# Patient Record
Sex: Female | Born: 1940 | Race: Black or African American | Hispanic: No | Marital: Single
Health system: Southern US, Community
[De-identification: ages and names within clinical notes are randomized; demographics above are authoritative.]

## PROBLEM LIST (undated history)

## (undated) DIAGNOSIS — J849 Interstitial pulmonary disease, unspecified: Secondary | ICD-10-CM

## (undated) DIAGNOSIS — J449 Chronic obstructive pulmonary disease, unspecified: Secondary | ICD-10-CM

## (undated) DIAGNOSIS — J181 Lobar pneumonia, unspecified organism: Secondary | ICD-10-CM

## (undated) DIAGNOSIS — J9621 Acute and chronic respiratory failure with hypoxia: Secondary | ICD-10-CM

---

## 2019-06-07 ENCOUNTER — Inpatient Hospital Stay
Admission: RE | Admit: 2019-06-07 | Discharge: 2019-07-08 | Disposition: A | Discharge status: Skilled Nursing Facility | Payer: Medicare Other | Source: Other Acute Inpatient Hospital | Attending: General Practice | Admitting: General Practice

## 2019-06-07 DIAGNOSIS — J181 Lobar pneumonia, unspecified organism: Secondary | ICD-10-CM | POA: Diagnosis present

## 2019-06-07 DIAGNOSIS — J9690 Respiratory failure, unspecified, unspecified whether with hypoxia or hypercapnia: Secondary | ICD-10-CM

## 2019-06-07 DIAGNOSIS — J9621 Acute and chronic respiratory failure with hypoxia: Secondary | ICD-10-CM | POA: Diagnosis present

## 2019-06-07 DIAGNOSIS — J849 Interstitial pulmonary disease, unspecified: Secondary | ICD-10-CM | POA: Diagnosis present

## 2019-06-07 DIAGNOSIS — J189 Pneumonia, unspecified organism: Secondary | ICD-10-CM

## 2019-06-07 DIAGNOSIS — R509 Fever, unspecified: Secondary | ICD-10-CM

## 2019-06-07 DIAGNOSIS — J449 Chronic obstructive pulmonary disease, unspecified: Secondary | ICD-10-CM | POA: Diagnosis present

## 2019-06-07 HISTORY — DX: Interstitial pulmonary disease, unspecified: J84.9

## 2019-06-07 HISTORY — DX: Acute and chronic respiratory failure with hypoxia: J96.21

## 2019-06-07 HISTORY — DX: Chronic obstructive pulmonary disease, unspecified: J44.9

## 2019-06-07 HISTORY — DX: Lobar pneumonia, unspecified organism: J18.1

## 2019-06-07 LAB — URINALYSIS, ROUTINE W REFLEX MICROSCOPIC
Bilirubin Urine: NEGATIVE
Glucose, UA: NEGATIVE mg/dL
Hgb urine dipstick: NEGATIVE
Ketones, ur: NEGATIVE mg/dL
Leukocytes,Ua: NEGATIVE
Nitrite: NEGATIVE
Protein, ur: NEGATIVE mg/dL
Specific Gravity, Urine: 1.005 (ref 1.005–1.030)
pH: 8 (ref 5.0–8.0)

## 2019-06-08 ENCOUNTER — Other Ambulatory Visit (HOSPITAL_COMMUNITY): Payer: Medicare Other

## 2019-06-08 LAB — PROTIME-INR
INR: 1.2 (ref 0.8–1.2)
Prothrombin Time: 14.8 seconds (ref 11.4–15.2)

## 2019-06-08 LAB — MAGNESIUM: Magnesium: 1.9 mg/dL (ref 1.7–2.4)

## 2019-06-08 LAB — CBC WITH DIFFERENTIAL/PLATELET
Abs Immature Granulocytes: 0.96 10*3/uL — ABNORMAL HIGH (ref 0.00–0.07)
Basophils Absolute: 0.2 10*3/uL — ABNORMAL HIGH (ref 0.0–0.1)
Basophils Relative: 1 %
Eosinophils Absolute: 0.5 10*3/uL (ref 0.0–0.5)
Eosinophils Relative: 2 %
HCT: 43.1 % (ref 36.0–46.0)
Hemoglobin: 13.7 g/dL (ref 12.0–15.0)
Immature Granulocytes: 5 %
Lymphocytes Relative: 7 %
Lymphs Abs: 1.3 10*3/uL (ref 0.7–4.0)
MCH: 24.9 pg — ABNORMAL LOW (ref 26.0–34.0)
MCHC: 31.8 g/dL (ref 30.0–36.0)
MCV: 78.2 fL — ABNORMAL LOW (ref 80.0–100.0)
Monocytes Absolute: 1.5 10*3/uL — ABNORMAL HIGH (ref 0.1–1.0)
Monocytes Relative: 8 %
Neutro Abs: 15.4 10*3/uL — ABNORMAL HIGH (ref 1.7–7.7)
Neutrophils Relative %: 77 %
Platelets: 267 10*3/uL (ref 150–400)
RBC: 5.51 MIL/uL — ABNORMAL HIGH (ref 3.87–5.11)
RDW: 18.2 % — ABNORMAL HIGH (ref 11.5–15.5)
WBC: 19.8 10*3/uL — ABNORMAL HIGH (ref 4.0–10.5)
nRBC: 0 % (ref 0.0–0.2)

## 2019-06-08 LAB — COMPREHENSIVE METABOLIC PANEL
ALT: 12 U/L (ref 0–44)
AST: 17 U/L (ref 15–41)
Albumin: 2.5 g/dL — ABNORMAL LOW (ref 3.5–5.0)
Alkaline Phosphatase: 64 U/L (ref 38–126)
Anion gap: 11 (ref 5–15)
BUN: 14 mg/dL (ref 8–23)
CO2: 28 mmol/L (ref 22–32)
Calcium: 8.7 mg/dL — ABNORMAL LOW (ref 8.9–10.3)
Chloride: 99 mmol/L (ref 98–111)
Creatinine, Ser: 0.64 mg/dL (ref 0.44–1.00)
GFR calc Af Amer: >60 mL/min (ref >60)
GFR calc non Af Amer: >60 mL/min (ref >60)
Glucose, Bld: 75 mg/dL (ref 70–99)
Potassium: 3.6 mmol/L (ref 3.5–5.1)
Sodium: 138 mmol/L (ref 135–145)
Total Bilirubin: 0.7 mg/dL (ref 0.3–1.2)
Total Protein: 5.7 g/dL — ABNORMAL LOW (ref 6.5–8.1)

## 2019-06-08 LAB — PROCALCITONIN: Procalcitonin: <0.1 ng/mL

## 2019-06-08 LAB — PHOSPHORUS: Phosphorus: 2.9 mg/dL (ref 2.5–4.6)

## 2019-06-08 MED ORDER — BENZOCAINE-MENTHOL 15-3.6 MG MT LOZG
1.00 | LOZENGE | OROMUCOSAL | Status: DC
Start: ? — End: 2019-06-08

## 2019-06-08 MED ORDER — IPRATROPIUM-ALBUTEROL 0.5-2.5 (3) MG/3ML IN SOLN
3.00 | RESPIRATORY_TRACT | Status: DC
Start: 2019-06-07 — End: 2019-06-08

## 2019-06-08 MED ORDER — GENERIC EXTERNAL MEDICATION
Status: DC
Start: ? — End: 2019-06-08

## 2019-06-08 MED ORDER — METHYLPREDNISOLONE SODIUM SUCC 40 MG IJ SOLR
10.00 | INTRAMUSCULAR | Status: DC
Start: 2019-06-08 — End: 2019-06-08

## 2019-06-08 MED ORDER — GENERIC EXTERNAL MEDICATION
500.00 | Status: DC
Start: 2019-06-07 — End: 2019-06-08

## 2019-06-08 MED ORDER — ENOXAPARIN SODIUM 40 MG/0.4ML ~~LOC~~ SOLN
40.00 | SUBCUTANEOUS | Status: DC
Start: 2019-06-08 — End: 2019-06-08

## 2019-06-08 MED ORDER — FERROUS SULFATE 325 (65 FE) MG PO TABS
325.00 | ORAL_TABLET | ORAL | Status: DC
Start: 2019-06-08 — End: 2019-06-08

## 2019-06-08 MED ORDER — NITROGLYCERIN 0.4 MG SL SUBL
0.40 | SUBLINGUAL_TABLET | SUBLINGUAL | Status: DC
Start: ? — End: 2019-06-08

## 2019-06-08 MED ORDER — MIDAZOLAM HCL 2 MG/2ML IJ SOLN
1.00 | INTRAMUSCULAR | Status: DC
Start: ? — End: 2019-06-08

## 2019-06-08 MED ORDER — FLUTICASONE PROPIONATE 50 MCG/ACT NA SUSP
1.00 | NASAL | Status: DC
Start: 2019-06-08 — End: 2019-06-08

## 2019-06-08 MED ORDER — SENNA-DOCUSATE SODIUM 8.6-50 MG PO TABS
1.00 | ORAL_TABLET | ORAL | Status: DC
Start: ? — End: 2019-06-08

## 2019-06-08 MED ORDER — HYDROCODONE-HOMATROPINE 5-1.5 MG/5ML PO SYRP
5.00 | ORAL_SOLUTION | ORAL | Status: DC
Start: ? — End: 2019-06-08

## 2019-06-08 MED ORDER — CARBOXYMETHYLCELLULOSE SODIUM OP
5.00 | OPHTHALMIC | Status: DC
Start: ? — End: 2019-06-08

## 2019-06-08 MED ORDER — SODIUM CHLORIDE 0.9 % IV SOLN
10.00 | INTRAVENOUS | Status: DC
Start: ? — End: 2019-06-08

## 2019-06-08 MED ORDER — ALPRAZOLAM 0.25 MG PO TABS
0.13 | ORAL_TABLET | ORAL | Status: DC
Start: ? — End: 2019-06-08

## 2019-06-08 MED ORDER — CVS TUSSIN DM CLEAR PO
0.50 | ORAL | Status: DC
Start: ? — End: 2019-06-08

## 2019-06-08 MED ORDER — ALUM & MAG HYDROXIDE-SIMETH 200-200-20 MG/5ML PO SUSP
30.00 | ORAL | Status: DC
Start: ? — End: 2019-06-08

## 2019-06-08 MED ORDER — BUDESONIDE-FORMOTEROL FUMARATE 160-4.5 MCG/ACT IN AERO
2.00 | INHALATION_SPRAY | RESPIRATORY_TRACT | Status: DC
Start: 2019-06-07 — End: 2019-06-08

## 2019-06-08 MED ORDER — GUAIFENESIN 400 MG PO TABS
400.00 | ORAL_TABLET | ORAL | Status: DC
Start: ? — End: 2019-06-08

## 2019-06-08 MED ORDER — LORATADINE 10 MG PO TABS
10.00 | ORAL_TABLET | ORAL | Status: DC
Start: 2019-06-08 — End: 2019-06-08

## 2019-06-08 MED ORDER — ACETAMINOPHEN 325 MG PO TABS
650.00 | ORAL_TABLET | ORAL | Status: DC
Start: ? — End: 2019-06-08

## 2019-06-08 MED ORDER — VITAMIN D3 25 MCG (1000 UNIT) PO TABS
1000.00 | ORAL_TABLET | ORAL | Status: DC
Start: 2019-06-08 — End: 2019-06-08

## 2019-06-09 ENCOUNTER — Other Ambulatory Visit (HOSPITAL_COMMUNITY): Payer: Medicare Other

## 2019-06-09 LAB — CBC
HCT: 35.5 % — ABNORMAL LOW (ref 36.0–46.0)
Hemoglobin: 11.6 g/dL — ABNORMAL LOW (ref 12.0–15.0)
MCH: 25.6 pg — ABNORMAL LOW (ref 26.0–34.0)
MCHC: 32.7 g/dL (ref 30.0–36.0)
MCV: 78.2 fL — ABNORMAL LOW (ref 80.0–100.0)
Platelets: 235 10*3/uL (ref 150–400)
RBC: 4.54 MIL/uL (ref 3.87–5.11)
RDW: 17.5 % — ABNORMAL HIGH (ref 11.5–15.5)
WBC: 19.7 10*3/uL — ABNORMAL HIGH (ref 4.0–10.5)
nRBC: 0 % (ref 0.0–0.2)

## 2019-06-09 LAB — BASIC METABOLIC PANEL
Anion gap: 11 (ref 5–15)
BUN: 12 mg/dL (ref 8–23)
CO2: 28 mmol/L (ref 22–32)
Calcium: 8.3 mg/dL — ABNORMAL LOW (ref 8.9–10.3)
Chloride: 98 mmol/L (ref 98–111)
Creatinine, Ser: 0.41 mg/dL — ABNORMAL LOW (ref 0.44–1.00)
GFR calc Af Amer: >60 mL/min (ref >60)
GFR calc non Af Amer: >60 mL/min (ref >60)
Glucose, Bld: 96 mg/dL (ref 70–99)
Potassium: 3.7 mmol/L (ref 3.5–5.1)
Sodium: 137 mmol/L (ref 135–145)

## 2019-06-09 LAB — MAGNESIUM: Magnesium: 1.9 mg/dL (ref 1.7–2.4)

## 2019-06-09 LAB — URINE CULTURE: Culture: <10000 — AB

## 2019-06-09 LAB — PHOSPHORUS: Phosphorus: 3.1 mg/dL (ref 2.5–4.6)

## 2019-06-09 MED ORDER — GENERIC EXTERNAL MEDICATION
Status: DC
Start: ? — End: 2019-06-09

## 2019-06-10 DIAGNOSIS — J449 Chronic obstructive pulmonary disease, unspecified: Secondary | ICD-10-CM | POA: Diagnosis not present

## 2019-06-10 DIAGNOSIS — J181 Lobar pneumonia, unspecified organism: Secondary | ICD-10-CM

## 2019-06-10 DIAGNOSIS — J849 Interstitial pulmonary disease, unspecified: Secondary | ICD-10-CM

## 2019-06-10 DIAGNOSIS — J9621 Acute and chronic respiratory failure with hypoxia: Secondary | ICD-10-CM | POA: Diagnosis not present

## 2019-06-10 LAB — BASIC METABOLIC PANEL
Anion gap: 9 (ref 5–15)
BUN: 12 mg/dL (ref 8–23)
CO2: 29 mmol/L (ref 22–32)
Calcium: 8.4 mg/dL — ABNORMAL LOW (ref 8.9–10.3)
Chloride: 97 mmol/L — ABNORMAL LOW (ref 98–111)
Creatinine, Ser: 0.46 mg/dL (ref 0.44–1.00)
GFR calc Af Amer: >60 mL/min (ref >60)
GFR calc non Af Amer: >60 mL/min (ref >60)
Glucose, Bld: 132 mg/dL — ABNORMAL HIGH (ref 70–99)
Potassium: 4.1 mmol/L (ref 3.5–5.1)
Sodium: 135 mmol/L (ref 135–145)

## 2019-06-10 LAB — CULTURE, RESPIRATORY W GRAM STAIN

## 2019-06-10 LAB — MAGNESIUM: Magnesium: 2.1 mg/dL (ref 1.7–2.4)

## 2019-06-10 NOTE — Consult Note (Signed)
Infectious Disease Consultation   Kara Alvarez  RUE:[id number]  DOB: 1941/01/16  DOA: 06/07/2019  Requesting physician: Dr.Hijazi  Reason for consultation: Antibiotic recommendations   History of Present Illness: History obtained from the medical records and personally confirmed with the patient. Kara Alvarez is an 78 y.o. female with history of COPD, emphysema, chronic hypoxemic respiratory failure, aortic aneurysm, atrial regurgitation, breast cancer that was hospitalized 9/19 through 10/1.  She returned the following day with worsening dyspnea.  At the time of admission she was in significant respiratory distress.  She was screened for Covid multiple times without any positive results.  At the time of arrival in the hospital she was found to have a PaO2 of 65 on 3 L of oxygen.  pH 7.5.  WBC count was elevated to 50,000 with 82% neutrophils.  Chest x-ray on 05/29/2019 showed emphysema with diffuse interstitial changes and fibrotic appearance with possible superimposed opacities suggestive of superimposed infection, suspected pneumonia.  She was started on IV vancomycin, cefepime and was given another course of azithromycin.  Blood cultures at the time of admission were negative.  MRSA PCR was negative.  Legionella antigen was negative.  Viral respiratory panel was also negative.  Acute on chronic hypoxemic respiratory failure was suspected secondary to COPD exacerbation, superimposed bacterial pneumonia.  There was also concern for interstitial lung disease.  She had reportedly failed outpatient treatment with Levaquin.  Infectious disease was consulted.  Patient did require intubation but was ultimately extubated and currently on oxygen by nasal cannula.  Antibiotics were later switched to meropenem.  BAL cultures did not show anything significant.  However, given the severity of her illness infectious disease recommended to continue treatment with meropenem for another 1 to 2 weeks.   There is also some concern for CT changes reflecting recurrent malignancy with history of breast cancer and lymphangitic spread.  She is currently on oxygen by nasal cannula.  She denies having any chest pain at this time.  She denies having any cough or fevers.  However, she is complaining of worsening shortness of breath especially when she moves around.  Review of Systems:  Complete review of system negative except as mentioned above in HPI.   Past Medical History: COPD, chronic respiratory failure, breast cancer, ascending aortic aneurysm, emphysema, hypertension, nonrheumatic mitral regurgitation  Past Surgical History: Mastectomy 12/08, 09/01, rotator cuff repair left 2007, tubal ligation 1977, vein ligation and stripping left  Allergies: Please see MAR  Social History: She is a former smoker, no history of alcohol or any other recreational drug abuse  Family History: History of colon cancer in mother, hypertension mother, prostate cancer in father, stroke and hypertension in her father, diabetes history, hypertension and sister.   Physical Exam: Temperature 98.1, pulse 99, respiratory rate 20, blood pressure 106/57, pulse oximetry 95% on 30 L with 85% FiO2. Constitutional: Thin, frail female, oriented x3. Eyes: PERLA, EOMI, irises appear normal, anicteric sclera,  ENMT: external ears and nose appear normal, normal hearing            Lips appears normal, oropharynx mucosa, tongue appear normal  Neck: neck appears normal, no masses, normal ROM, no thyromegaly, no JVD  CVS: S1-S2, no gallops, no LE edema, normal pedal pulses  Respiratory: Coarse breath sounds, rhonchi, scattered crackles Abdomen: soft nontender, nondistended, normal bowel sounds  Musculoskeletal: no cyanosis, clubbing or edema noted bilaterally  Neuro: She has some generalized weakness otherwise no focal deficits Psych: judgement and insight appear normal, stable mood and affect, mental  status Skin: no rashes   Data reviewed:  I have personally reviewed following labs and imaging studies Labs:  CBC: Recent Labs  Lab 06/08/19 0734 06/09/19 0526  WBC 19.8* 19.7*  NEUTROABS 15.4*  --   HGB 13.7 11.6*  HCT 43.1 35.5*  MCV 78.2* 78.2*  PLT 267 235    Basic Metabolic Panel: Recent Labs  Lab 06/08/19 0734 06/09/19 0526 06/10/19 0652  NA 138 137 135  K 3.6 3.7 4.1  CL 99 98 97*  CO2 28 28 29   GLUCOSE 75 96 132*  BUN 14 12 12   CREATININE 0.64 0.41* 0.46  CALCIUM 8.7* 8.3* 8.4*  MG 1.9 1.9 2.1  PHOS 2.9 3.1  --    GFR CrCl cannot be calculated (Unknown ideal weight.). Liver Function Tests: Recent Labs  Lab 06/08/19 0734  AST 17  ALT 12  ALKPHOS 64  BILITOT 0.7  PROT 5.7*  ALBUMIN 2.5*   No results for input(s): LIPASE, AMYLASE in the last 168 hours. No results for input(s): AMMONIA in the last 168 hours. Coagulation profile Recent Labs  Lab 06/08/19 0734  INR 1.2    Cardiac Enzymes: No results for input(s): CKTOTAL, CKMB, CKMBINDEX, TROPONINI in the last 168 hours. BNP: Invalid input(s): POCBNP CBG: No results for input(s): GLUCAP in the last 168 hours. D-Dimer No results for input(s): DDIMER in the last 72 hours. Hgb A1c No results for input(s): HGBA1C in the last 72 hours. Lipid Profile No results for input(s): CHOL, HDL, LDLCALC, TRIG, CHOLHDL, LDLDIRECT in the last 72 hours. Thyroid function studies No results for input(s): TSH, T4TOTAL, T3FREE, THYROIDAB in the last 72 hours.  Invalid input(s): FREET3 Anemia work up No results for input(s): VITAMINB12, FOLATE, FERRITIN, TIBC, IRON, RETICCTPCT in the last 72 hours. Urinalysis    Component Value Date/Time   COLORURINE STRAW (A) 06/07/2019 1822   APPEARANCEUR CLEAR 06/07/2019 1822   LABSPEC 1.005 06/07/2019 1822   PHURINE 8.0 06/07/2019 1822   GLUCOSEU NEGATIVE 06/07/2019 1822   HGBUR NEGATIVE 06/07/2019 1822   BILIRUBINUR NEGATIVE 06/07/2019 1822   KETONESUR NEGATIVE  06/07/2019 1822   PROTEINUR NEGATIVE 06/07/2019 1822   NITRITE NEGATIVE 06/07/2019 1822   LEUKOCYTESUR NEGATIVE 06/07/2019 1822     Microbiology Recent Results (from the past 240 hour(s))  Culture, blood (routine x 2)     Status: None (Preliminary result)   Collection Time: 06/07/19  7:40 PM   Specimen: BLOOD  Result Value Ref Range Status   Specimen Description BLOOD LEFT ARM  Final   Special Requests   Final    BOTTLES DRAWN AEROBIC ONLY Blood Culture results may not be optimal due to an inadequate volume of blood received in culture bottles   Culture   Final    NO GROWTH 2 DAYS Performed at [facility], [address] [place], [place] [address]    Report Status PENDING  Incomplete  Culture, blood (routine x 2)     Status: None (Preliminary result)   Collection Time: 06/07/19  7:49 PM   Specimen: BLOOD  Result Value Ref Range Status   Specimen Description BLOOD LEFT HAND  Final   Special Requests   Final    BOTTLES DRAWN AEROBIC ONLY Blood Culture results may not be optimal due to an inadequate volume of blood received in culture bottles   Culture   Final  NO GROWTH 2 DAYS Performed at [facility], [place] [address] [place], [place] [address]    Report Status PENDING  Incomplete  Culture, Urine     Status: Abnormal   Collection Time: 06/07/19 10:30 PM   Specimen: Urine, Random  Result Value Ref Range Status   Specimen Description URINE, RANDOM  Final   Special Requests NONE  Final   Culture (A)  Final    <10,000 COLONIES/mL INSIGNIFICANT GROWTH Performed at [facility] [address], [place], [place] [address]    Report Status 06/09/2019 FINAL  Final  Culture, respiratory     Status: None   Collection Time: 06/08/19  5:10 AM   Specimen: Tracheal Aspirate  Result Value Ref Range Status   Specimen Description TRACHEAL ASPIRATE  Final   Special Requests NONE  Final   Gram Stain   Final    FEW SQUAMOUS EPITHELIAL CELLS PRESENT NO WBC  SEEN MODERATE GRAM POSITIVE COCCI IN CLUSTERS IN PAIRS FEW YEAST FEW GRAM NEGATIVE RODS Performed at [facility], [place] [address] [place], [place] [address]    Culture ABUNDANT CANDIDA ALBICANS  Final   Report Status 06/10/2019 FINAL  Final       Inpatient Medications:   Please see MAR   Radiological Exams on Admission: Dg Chest Port 1 View  Result Date: 06/09/2019 CLINICAL DATA:  Pneumonia. EXAM: PORTABLE CHEST 1 VIEW COMPARISON:  June 08, 2019. FINDINGS: Stable cardiomegaly. No pneumothorax is noted. Stable multiple opacities are noted throughout both lungs. Small right pleural effusion is noted. Bony thorax is unremarkable. IMPRESSION: Stable multifocal pneumonia. Electronically Signed   By: Marijo Conception M.D.   On: 06/09/2019 12:51    Impression/Recommendations Acute on chronic hypoxemic respiratory failure Bilateral pneumonia Severe COPD with exacerbation Leukocytosis Interstitial lung disease History of breast cancer Hypertension Protein calorie malnutrition Debility  Acute on chronic hypoxemic respiratory failure: Multifactorial.  Likely secondary to severe COPD with exacerbation.  However, patient also noted to have diffuse interstitial changes could be secondary to pneumonia but could also be interstitial lung disease with interstitial fibrosis.  If her respiratory status does not improve, would recommend chest CT to better evaluate.  Bilateral pneumonia: Patient was previously on azithromycin at the outside hospital.  She failed outpatient treatment with Levaquin.  Her respiratory status continued to worsen therefore she was initiated on IV meropenem at outside facility.  She seems to be somewhat responding to the IV meropenem.  We will plan to treat until 06/22/2019.  However, there may be some component of interstitial lung disease as well as mentioned above which could be contributing to the respiratory failure.  If she starts having any fevers greater  than 101 would recommend to send for pan cultures. She denies having sputum production at this time.  Severe COPD with exacerbation: Patient currently on steroids.  Pulmonary also following.  Continue management per primary team and pulmonary.    Leukocytosis: Likely secondary to the steroids?  Already on antibiotics.  Currently afebrile.  Continue to monitor.  If she is worsening recommend chest CT to evaluate.  History of breast cancer: There was some concern for progression of her breast cancer and also lymphangitic spread per the discharge notes from the outside facility.  Per oncology they advised supportive care.  When able, and if she decides to pursue evaluation and treatment she would need CT chest abdomen and pelvis and bone scan with biopsy if any abnormality.  Protein calorie  malnutrition: Management per the primary team.  Debility: Continue supportive management and therapy per the primary team.  Due to her complex medical problems she is a high risk for worsening anticoagulation.  Thank you for this consultation.      Vonzella Nipple M.D. 06/10/2019, 5:22 PM

## 2019-06-10 NOTE — Consult Note (Signed)
[facility]  Date of Service: 06/10/2019  PULMONARY CRITICAL CARE Kara Alvarez  WUJ:[id number]  DOB: Mar 23, 1941   DOA: 06/07/2019  Referring Physician: Merton Border, MD  HPI: Kara Alvarez is a 78 y.o. female seen for follow up of Acute on Chronic Respiratory Failure.  Patient has multiple medical problems including aortic aneurysm breast cancer COPD emphysema essential hypertension mitral regurgitation presented to the hospital with increasing shortness of breath chills.  Patient was evaluated at that time was found to have pneumonia bilaterally and also was in respiratory failure.  She was tested for COVID-19 which was found to be negative on multiple attempts.  Her history goes back to late August where she apparently got worse with shortness of breath and had been placed on some steroids.  The patient at that time was not felt to be having any infectious process.  Presentation this time patient had a PO2 of 65 on 3 L of oxygen.  Based on the CT results from the previous facility she had a diffuse lung disease with groundglass opacities.  Her work-up at the other facility also included doing bronchoscopy.  There is concern whether the patient may also have some interstitial lung disease.  CT scan here is not been done yet  Review of Systems:  ROS performed and is unremarkable other than noted above.  Past Medical History:  Diagnosis Date  . Ascending aortic aneurysm (*)  . Breast cancer (*) 04/2000  T2N0M0 stage IIA s/p R-mast/chemo/XRT  . Breast cancer (*) 2008  T1cN0M0 stage II s/p L-mast. Intolerant of AI  . Contact and allergic dermatitis of eyelid  . Emphysema lung (*) 12/21/2013  Noted incidentally on CT scan of the chest; No symptoms  . Essential hypertension 01/18/2017  . Non-rheumatic mitral regurgitation 01/18/2017   Past Surgical History:  Procedure Laterality Date  . Mastectomy 12/08, 09/01   . Rotator cuff repair Left 2007  . Tubal ligation 1977  . Vein ligation and stripping Left   Family History  Problem Relation Age of Onset  . Cancer Mother  colon  . Hypertension Mother  . Cancer Father  prostate  . Stroke Father  . Hypertension Father  . Diabetes Other  . Hypertension Sister  . Heart disease Neg Hx   Social History   Socioeconomic History  . Marital status: Single  Spouse name: Not on file  . Number of children: Not on file  . Years of education: Not on file  . Highest education level: Not on file  Occupational History  . Not on file  Social Needs  . Financial resource strain: Not on file  . Food insecurity  Worry: Not on file  Inability: Not on file  . Transportation needs  Medical: Not on file  Non-medical: Not on file  Tobacco Use  . Smoking status: Former Smoker  Packs/day: 0.75  Years: 39.00  Pack years: 29.25  Quit date: 12/02/1993     Medications: Reviewed on Rounds  Physical Exam:  Vitals: Temperature 97.4 pulse 80 respiratory rate 30 blood pressure 105/62 saturations 96%  Ventilator Settings off the ventilator right now on high flow nasal cannula patient is on 30 L with 85% FiO2  . General: Comfortable at this time . Eyes: Grossly normal lids, irises & conjunctiva . ENT: grossly tongue is normal . Neck: no obvious mass . Cardiovascular: S1-S2 normal no gallop or rub . Respiratory: Coarse breath sounds scattered crackles .  Abdomen: Soft nontender . Skin: no rash seen on limited exam . Musculoskeletal: not rigid . Psychiatric:unable to assess . Neurologic: no seizure no involuntary movements         Labs on Admission:  Basic Metabolic Panel: Recent Labs  Lab 06/08/19 0734 06/09/19 0526 06/10/19 0652  NA 138 137 135  K 3.6 3.7 4.1  CL 99 98 97*  CO2 28 28 29   GLUCOSE 75 96 132*  BUN 14 12 12   CREATININE 0.64 0.41* 0.46  CALCIUM 8.7* 8.3* 8.4*  MG 1.9 1.9 2.1  PHOS 2.9 3.1  --     No results for input(s):  PHART, PCO2ART, PO2ART, HCO3, O2SAT in the last 168 hours.  Liver Function Tests: Recent Labs  Lab 06/08/19 0734  AST 17  ALT 12  ALKPHOS 64  BILITOT 0.7  PROT 5.7*  ALBUMIN 2.5*   No results for input(s): LIPASE, AMYLASE in the last 168 hours. No results for input(s): AMMONIA in the last 168 hours.  CBC: Recent Labs  Lab 06/08/19 0734 06/09/19 0526  WBC 19.8* 19.7*  NEUTROABS 15.4*  --   HGB 13.7 11.6*  HCT 43.1 35.5*  MCV 78.2* 78.2*  PLT 267 235    Cardiac Enzymes: No results for input(s): CKTOTAL, CKMB, CKMBINDEX, TROPONINI in the last 168 hours.  BNP (last 3 results) No results for input(s): BNP in the last 8760 hours.  ProBNP (last 3 results) No results for input(s): PROBNP in the last 8760 hours.   Radiological Exams on Admission: Dg Chest Port 1 View  Result Date: 06/09/2019 CLINICAL DATA:  Pneumonia. EXAM: PORTABLE CHEST 1 VIEW COMPARISON:  June 08, 2019. FINDINGS: Stable cardiomegaly. No pneumothorax is noted. Stable multiple opacities are noted throughout both lungs. Small right pleural effusion is noted. Bony thorax is unremarkable. IMPRESSION: Stable multifocal pneumonia. Electronically Signed   By: 06/11/2019 M.D.   On: 06/09/2019 12:51   Dg Chest Port 1 View  Result Date: 06/08/2019 CLINICAL DATA:  Shortness of breath. EXAM: PORTABLE CHEST 1 VIEW COMPARISON:  None. FINDINGS: Mild cardiomegaly is noted. Atherosclerosis of thoracic aorta is noted. No pneumothorax is noted. Bilateral lung opacities are noted concerning for multifocal pneumonia. Small bilateral pleural effusions are noted. Bony thorax is unremarkable. IMPRESSION: Bilateral lung opacities are noted concerning for multifocal pneumonia. Aortic Atherosclerosis (ICD10-I70.0). Electronically Signed   By: 06/11/2019 M.D.   On: 06/08/2019 07:10    Acute Interface, Incoming Rad Results - 05/17/2019 11:47 AM EDT INDICATION: Chest pain, PE suspected, high prob h/o breast ca with  hypoxia. COMPARISON:  August 25, 2018.  TECHNIQUE:  Routine CT pulmonary angiogram with contrast was performed using Contrast:  90 mL mL of Isovue 370.  Multiplanar 2D and angiographic 3D MIP images were constructed and reviewed. Radiation dose reduction was utilized (automated exposure  control, mA or kV adjustment based on patient size, or iterative image reconstruction).  FINDINGS: MEDIASTINUM/GREAT VESSELS: Cardiomegaly. The ascending thoracic aorta measures 4.4 cm, which is unchanged. There is mediastinal and right hilar lymphadenopathy. Index subcarinal lymph node measuring 2.7 x 1.8 cm. Index right hilar lymph node measuring 2.5 x 1.7 cm.. Enlarged  central pulmonary arteries. No filling defects within the pulmonary arterial tree to suggest underlying pulmonary emboli. No acute abnormality of the thoracic aorta or other great vessels of the mediastinum.   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  Limitations:  None  LUNG WINDOWS:  Background of centrilobular and paraseptal emphysema. There  are new basilar predominant subpleural groundglass opacities and septal thickening. Apical scarring.  UPPER ABDOMEN:  Stable small cyst in the left hepatic lobe. Small left renal cyst.  BONE WINDOWS:  No acute osseous findings. Mastectomies.   IMPRESSION:  1. Negative for pulmonary embolism. 2. Stable ascending aortic aneurysm. 3. Enlarged central pulmonary arteries suggestive of pulmonary arterial hypertension. 4. New basilar predominant subpleural opacities suggestive of pneumonia. 5. Mediastinal and right hilar lymphadenopathy which may relate to impression #4. Recommend attention on follow-up imaging to ensure normalization of the lymph nodes. 6. Emphysema.   Electronically Signed by: Barrett HenleJeremy Cuda   Assessment/Plan Active Problems:   Acute on chronic respiratory failure with hypoxia (HCC)   Interstitial lung disease (HCC)   Lobar pneumonia,  unspecified organism (HCC)   COPD, severe (HCC)   1. Acute on chronic respiratory failure with hypoxia patient has diffuse interstitial changes which could be pneumonia but more likely is interstitial fibrosis.  The CT scan done at the other facility had shown significant changes with subpleural opacities and also groundglass changes.  I would recommend doing a follow-up CT scan here to reassess if there has been any change since September 2. Possible pulmonary fibrosis see discussion above discussion above.  She may have early changes of interstitial fibrosis would recommend getting a follow-up CT scan now. 3. Lobar pneumonia patient treated with antibiotics as already mentioned at the other facility.  She still had some groundglass opacities but this is a CT back from September that we are referencing. 4. COPD by history nebulizers as necessary we will continue with supportive care.  As already mentioned above need to get a follow-up CT scan of the chest which I have asked for.  Currently oxygen requirements are significantly elevated  I have personally seen and evaluated the patient, evaluated laboratory and imaging results, formulated the assessment and plan and placed orders. The Patient requires high complexity decision making for assessment and support.  Case was discussed on Rounds with the Respiratory Therapy Staff Time Spent 70minutes  Yevonne PaxSaadat A Darrnell Mangiaracina, MD [facility] Pulmonary Critical Care Medicine Sleep Medicine

## 2019-06-11 ENCOUNTER — Encounter: Payer: Self-pay | Admitting: Internal Medicine

## 2019-06-11 DIAGNOSIS — J9621 Acute and chronic respiratory failure with hypoxia: Secondary | ICD-10-CM | POA: Diagnosis present

## 2019-06-11 DIAGNOSIS — J449 Chronic obstructive pulmonary disease, unspecified: Secondary | ICD-10-CM | POA: Diagnosis present

## 2019-06-11 DIAGNOSIS — J849 Interstitial pulmonary disease, unspecified: Secondary | ICD-10-CM | POA: Diagnosis not present

## 2019-06-11 DIAGNOSIS — J181 Lobar pneumonia, unspecified organism: Secondary | ICD-10-CM | POA: Diagnosis present

## 2019-06-11 LAB — BASIC METABOLIC PANEL
Anion gap: 8 (ref 5–15)
BUN: 15 mg/dL (ref 8–23)
CO2: 32 mmol/L (ref 22–32)
Calcium: 8.7 mg/dL — ABNORMAL LOW (ref 8.9–10.3)
Chloride: 99 mmol/L (ref 98–111)
Creatinine, Ser: 0.51 mg/dL (ref 0.44–1.00)
GFR calc Af Amer: >60 mL/min (ref >60)
GFR calc non Af Amer: >60 mL/min (ref >60)
Glucose, Bld: 119 mg/dL — ABNORMAL HIGH (ref 70–99)
Potassium: 3.9 mmol/L (ref 3.5–5.1)
Sodium: 139 mmol/L (ref 135–145)

## 2019-06-11 LAB — CBC
HCT: 38.2 % (ref 36.0–46.0)
Hemoglobin: 11.9 g/dL — ABNORMAL LOW (ref 12.0–15.0)
MCH: 24.6 pg — ABNORMAL LOW (ref 26.0–34.0)
MCHC: 31.2 g/dL (ref 30.0–36.0)
MCV: 79.1 fL — ABNORMAL LOW (ref 80.0–100.0)
Platelets: 275 10*3/uL (ref 150–400)
RBC: 4.83 MIL/uL (ref 3.87–5.11)
RDW: 17.5 % — ABNORMAL HIGH (ref 11.5–15.5)
WBC: 18.1 10*3/uL — ABNORMAL HIGH (ref 4.0–10.5)
nRBC: 0 % (ref 0.0–0.2)

## 2019-06-11 LAB — PHOSPHORUS: Phosphorus: 3.4 mg/dL (ref 2.5–4.6)

## 2019-06-11 LAB — MAGNESIUM: Magnesium: 2 mg/dL (ref 1.7–2.4)

## 2019-06-11 NOTE — Progress Notes (Signed)
Pulmonary Critical Care Medicine [place]   PULMONARY CRITICAL CARE SERVICE  PROGRESS NOTE  Date of Service: 06/11/2019  Kara Alvarez  OZD:[id number]  DOB: 1941-05-06   DOA: 06/07/2019  Referring Physician: Merton Border, MD  HPI: Kara Alvarez is a 78 y.o. female seen for follow up of Acute on Chronic Respiratory Failure.  Patient currently is on high flow nasal cannula on 25 L with 80% FiO2 flow rate  Medications: Reviewed on Rounds  Physical Exam:  Vitals: Temperature is 97.2 pulse 82 respiratory rate 18 blood pressure is 113/65 saturations 96%  Ventilator Settings patient currently is off of the ventilator at this time.  . General: Comfortable at this time . Eyes: Grossly normal lids, irises & conjunctiva . ENT: grossly tongue is normal . Neck: no obvious mass . Cardiovascular: S1 S2 normal no gallop . Respiratory: No rhonchi coarse breath sounds are noted . Abdomen: soft . Skin: no rash seen on limited exam . Musculoskeletal: not rigid . Psychiatric:unable to assess . Neurologic: no seizure no involuntary movements         Lab Data:   Basic Metabolic Panel: Recent Labs  Lab 06/08/19 0734 06/09/19 0526 06/10/19 0652 06/11/19 0822  NA 138 137 135 139  K 3.6 3.7 4.1 3.9  CL 99 98 97* 99  CO2 28 28 29  32  GLUCOSE 75 96 132* 119*  BUN 14 12 12 15   CREATININE 0.64 0.41* 0.46 0.51  CALCIUM 8.7* 8.3* 8.4* 8.7*  MG 1.9 1.9 2.1 2.0  PHOS 2.9 3.1  --  3.4    ABG: No results for input(s): PHART, PCO2ART, PO2ART, HCO3, O2SAT in the last 168 hours.  Liver Function Tests: Recent Labs  Lab 06/08/19 0734  AST 17  ALT 12  ALKPHOS 64  BILITOT 0.7  PROT 5.7*  ALBUMIN 2.5*   No results for input(s): LIPASE, AMYLASE in the last 168 hours. No results for input(s): AMMONIA in the last 168 hours.  CBC: Recent Labs  Lab 06/08/19 0734 06/09/19 0526 06/11/19 0822  WBC 19.8* 19.7* 18.1*  NEUTROABS 15.4*  --   --   HGB 13.7 11.6* 11.9*   HCT 43.1 35.5* 38.2  MCV 78.2* 78.2* 79.1*  PLT 267 235 275    Cardiac Enzymes: No results for input(s): CKTOTAL, CKMB, CKMBINDEX, TROPONINI in the last 168 hours.  BNP (last 3 results) No results for input(s): BNP in the last 8760 hours.  ProBNP (last 3 results) No results for input(s): PROBNP in the last 8760 hours.  Radiological Exams: No results found.  Assessment/Plan Active Problems:   Acute on chronic respiratory failure with hypoxia (HCC)   Interstitial lung disease (HCC)   Lobar pneumonia, unspecified organism (HCC)   COPD, severe ([place])   1. Acute on chronic respiratory failure with hypoxia we will continue with high flow nasal cannula 80% FiO2 and 25 L flow rate patient's oxygen saturations are improving but will need to be very careful as far as weaning down the FiO2. 2. Interstitial lung disease unclear etiology patient has been in the past treated with steroids she is also given antibiotics.  Again would recommend getting a follow-up CT once she is more stable 3. Lobar pneumonia unspecified we will continue with present management patient has also been consulted with infectious disease will await their recommendations 4. Severe COPD nebulizers as necessary   I have personally seen and evaluated the patient, evaluated laboratory and imaging results, formulated the assessment and plan and placed orders.  The Patient requires high complexity decision making for assessment and support.  Case was discussed on Rounds with the Respiratory Therapy Staff  Allyne Gee, MD [facility] Pulmonary Critical Care Medicine Sleep Medicine

## 2019-06-12 DIAGNOSIS — J181 Lobar pneumonia, unspecified organism: Secondary | ICD-10-CM | POA: Diagnosis not present

## 2019-06-12 DIAGNOSIS — J9621 Acute and chronic respiratory failure with hypoxia: Secondary | ICD-10-CM | POA: Diagnosis not present

## 2019-06-12 DIAGNOSIS — J449 Chronic obstructive pulmonary disease, unspecified: Secondary | ICD-10-CM | POA: Diagnosis not present

## 2019-06-12 DIAGNOSIS — J849 Interstitial pulmonary disease, unspecified: Secondary | ICD-10-CM | POA: Diagnosis not present

## 2019-06-12 LAB — CULTURE, BLOOD (ROUTINE X 2)
Culture: NO GROWTH
Culture: NO GROWTH

## 2019-06-12 NOTE — Progress Notes (Addendum)
Pulmonary Critical Care Medicine [place]   PULMONARY CRITICAL CARE SERVICE  PROGRESS NOTE  Date of Service: 06/12/2019  Nicha Hemann  XFG:[id number]  DOB: 12/06/1940   DOA: 06/07/2019  Referring Physician: Merton Border, MD  HPI: Palma Buster is a 78 y.o. female seen for follow up of Acute on Chronic Respiratory Failure.  Patient is here for nasal cannula 12 L and 60% FiO2 at this time.  Medications: Reviewed on Rounds  Physical Exam:  Vitals: Pulse 84 respirations 19 BP 118/69 O2 sat 96%, temp 97.6  Ventilator Settings.  Average 60% FiO2 at 12 L.  . General: Comfortable at this time . Eyes: Grossly normal lids, irises & conjunctiva . ENT: grossly tongue is normal . Neck: no obvious mass . Cardiovascular: S1 S2 normal no gallop . Respiratory: No rales or rhonchi noted . Abdomen: soft . Skin: no rash seen on limited exam . Musculoskeletal: not rigid . Psychiatric:unable to assess . Neurologic: no seizure no involuntary movements         Lab Data:   Basic Metabolic Panel: Recent Labs  Lab 06/08/19 0734 06/09/19 0526 06/10/19 0652 06/11/19 0822  NA 138 137 135 139  K 3.6 3.7 4.1 3.9  CL 99 98 97* 99  CO2 28 28 29  32  GLUCOSE 75 96 132* 119*  BUN 14 12 12 15   CREATININE 0.64 0.41* 0.46 0.51  CALCIUM 8.7* 8.3* 8.4* 8.7*  MG 1.9 1.9 2.1 2.0  PHOS 2.9 3.1  --  3.4    ABG: No results for input(s): PHART, PCO2ART, PO2ART, HCO3, O2SAT in the last 168 hours.  Liver Function Tests: Recent Labs  Lab 06/08/19 0734  AST 17  ALT 12  ALKPHOS 64  BILITOT 0.7  PROT 5.7*  ALBUMIN 2.5*   No results for input(s): LIPASE, AMYLASE in the last 168 hours. No results for input(s): AMMONIA in the last 168 hours.  CBC: Recent Labs  Lab 06/08/19 0734 06/09/19 0526 06/11/19 0822  WBC 19.8* 19.7* 18.1*  NEUTROABS 15.4*  --   --   HGB 13.7 11.6* 11.9*  HCT 43.1 35.5* 38.2  MCV 78.2* 78.2* 79.1*  PLT 267 235 275    Cardiac Enzymes: No  results for input(s): CKTOTAL, CKMB, CKMBINDEX, TROPONINI in the last 168 hours.  BNP (last 3 results) No results for input(s): BNP in the last 8760 hours.  ProBNP (last 3 results) No results for input(s): PROBNP in the last 8760 hours.  Radiological Exams: No results found.  Assessment/Plan Active Problems:   Acute on chronic respiratory failure with hypoxia (HCC)   Interstitial lung disease (HCC)   Lobar pneumonia, unspecified organism (HCC)   COPD, severe ([place])   1. Acute on chronic respiratory failure with hypoxia we will continue with high flow nasal cannula at 12 L and 60% FiO2.  Continue aggressive pulmonary toilet supportive measures.  Wean as tolerated. 2. Interstitial lung disease unclear etiology patient has been in the past treated with steroids she is also given antibiotics.  Again would recommend getting a follow-up CT once she is more stable 3. Lobar pneumonia unspecified we will continue with present management patient has also been consulted with infectious disease will await their recommendations 4. Severe COPD nebulizers as necessary    I have personally seen and evaluated the patient, evaluated laboratory and imaging results, formulated the assessment and plan and placed orders. The Patient requires high complexity decision making for assessment and support.  Case was discussed on Rounds with  the Respiratory Therapy Staff  Allyne Gee, MD [facility] Pulmonary Critical Care Medicine Sleep Medicine

## 2019-06-13 ENCOUNTER — Other Ambulatory Visit (HOSPITAL_COMMUNITY): Payer: Medicare Other

## 2019-06-13 DIAGNOSIS — J181 Lobar pneumonia, unspecified organism: Secondary | ICD-10-CM | POA: Diagnosis not present

## 2019-06-13 DIAGNOSIS — J849 Interstitial pulmonary disease, unspecified: Secondary | ICD-10-CM | POA: Diagnosis not present

## 2019-06-13 DIAGNOSIS — J9621 Acute and chronic respiratory failure with hypoxia: Secondary | ICD-10-CM | POA: Diagnosis not present

## 2019-06-13 DIAGNOSIS — J449 Chronic obstructive pulmonary disease, unspecified: Secondary | ICD-10-CM | POA: Diagnosis not present

## 2019-06-13 MED ORDER — GENERIC EXTERNAL MEDICATION
Status: DC
Start: ? — End: 2019-06-13

## 2019-06-13 NOTE — Progress Notes (Addendum)
Pulmonary Critical Care Medicine [facility] [place]   PULMONARY CRITICAL CARE SERVICE  PROGRESS NOTE  Date of Service: 06/13/2019  Kara Alvarez  WVP:[id number]  DOB: 11-Dec-1940   DOA: 06/07/2019  Referring Physician: Carron Curie, MD  HPI: Kara Alvarez is a 78 y.o. female seen for follow up of Acute on Chronic Respiratory Failure.  Patient is on heated high flow cannula 16 L and 50% FiO2.  Medications: Reviewed on Rounds  Physical Exam:  Vitals: Pulse 85 respirations 18 BP 119/73 O2 sat 96% temp 96.6  Ventilator Settings heated 16 L and 50%  . General: Comfortable at this time . Eyes: Grossly normal lids, irises & conjunctiva . ENT: grossly tongue is normal . Neck: no obvious mass . Cardiovascular: S1 S2 normal no gallop . Respiratory: No rales or rhonchi noted . Abdomen: soft . Skin: no rash seen on limited exam . Musculoskeletal: not rigid . Psychiatric:unable to assess . Neurologic: no seizure no involuntary movements         Lab Data:   Basic Metabolic Panel: Recent Labs  Lab 06/08/19 0734 06/09/19 0526 06/10/19 0652 06/11/19 0822  NA 138 137 135 139  K 3.6 3.7 4.1 3.9  CL 99 98 97* 99  CO2 28 28 29  32  GLUCOSE 75 96 132* 119*  BUN 14 12 12 15   CREATININE 0.64 0.41* 0.46 0.51  CALCIUM 8.7* 8.3* 8.4* 8.7*  MG 1.9 1.9 2.1 2.0  PHOS 2.9 3.1  --  3.4    ABG: No results for input(s): PHART, PCO2ART, PO2ART, HCO3, O2SAT in the last 168 hours.  Liver Function Tests: Recent Labs  Lab 06/08/19 0734  AST 17  ALT 12  ALKPHOS 64  BILITOT 0.7  PROT 5.7*  ALBUMIN 2.5*   No results for input(s): LIPASE, AMYLASE in the last 168 hours. No results for input(s): AMMONIA in the last 168 hours.  CBC: Recent Labs  Lab 06/08/19 0734 06/09/19 0526 06/11/19 0822  WBC 19.8* 19.7* 18.1*  NEUTROABS 15.4*  --   --   HGB 13.7 11.6* 11.9*  HCT 43.1 35.5* 38.2  MCV 78.2* 78.2* 79.1*  PLT 267 235 275    Cardiac Enzymes: No results for  input(s): CKTOTAL, CKMB, CKMBINDEX, TROPONINI in the last 168 hours.  BNP (last 3 results) No results for input(s): BNP in the last 8760 hours.  ProBNP (last 3 results) No results for input(s): PROBNP in the last 8760 hours.  Radiological Exams: Ct Chest High Resolution  Result Date: 06/13/2019 CLINICAL DATA:  78 year old female with clinical concern for interstitial lung disease. EXAM: CT CHEST WITHOUT CONTRAST TECHNIQUE: Multidetector CT imaging of the chest was performed following the standard protocol without intravenous contrast. High resolution imaging of the lungs, as well as inspiratory and expiratory imaging, was performed. COMPARISON:  No priors. FINDINGS: Cardiovascular: Heart size is normal. There is no significant pericardial fluid, thickening or pericardial calcification. There is aortic atherosclerosis, as well as atherosclerosis of the great vessels of the mediastinum and the coronary arteries, including calcified atherosclerotic plaque in the left circumflex and right coronary arteries. Severe calcifications of the mitral annulus. Aneurysmal dilatation of the ascending thoracic aorta (4.5 cm in diameter). Mediastinum/Nodes: No pathologically enlarged mediastinal or hilar lymph nodes. Please note that accurate exclusion of hilar adenopathy is limited on noncontrast CT scans. Esophagus is unremarkable in appearance. No axillary lymphadenopathy. Lungs/Pleura: Study is limited by extensive patient respiratory motion and areas of airspace consolidation in the lung bases bilaterally. With these limitations  in mind, high-resolution images demonstrate diffuse bronchial wall thickening with moderate centrilobular and paraseptal emphysema. There are areas of septal thickening most evident throughout the mid to lower lungs bilaterally, however, this is confounded by apparent acute airspace consolidation. Some areas appear to reflect honeycombing, however, this may simply represent emphysema with  superimposed septal thickening. Inspiratory and expiratory imaging is unremarkable. Airspace consolidation is most evident in the lower lobes of the lungs bilaterally. No pleural effusions. No definite suspicious appearing pulmonary nodules or masses are noted. Upper Abdomen: Aortic atherosclerosis. Musculoskeletal: There are no aggressive appearing lytic or blastic lesions noted in the visualized portions of the skeleton. IMPRESSION: 1. Study is essentially nondiagnostic for accurate assessment of interstitial lung disease in the setting of acute infection (bilateral lower lobe pneumonia). If there is persistent clinical concern for interstitial lung disease, the patient should be reimaged with high-resolution chest CT in 3-6 months after complete resolution of the pneumonia to better evaluate potential interstitial lung disease. 2. Aortic atherosclerosis, in addition to 2 vessel coronary artery disease. In addition, there is mild aneurysmal dilatation of the ascending thoracic aorta (4.5 cm in diameter). Ascending thoracic aortic aneurysm. Recommend semi-annual imaging followup by CTA or MRA and referral to cardiothoracic surgery if not already obtained. This recommendation follows 2010 ACCF/AHA/AATS/ACR/ASA/SCA/SCAI/SIR/STS/SVM Guidelines for the Diagnosis and Management of Patients With Thoracic Aortic Disease. Circulation. 2010; 121: Y606-T016. Aortic aneurysm NOS (ICD10-I71.9). 3. There are calcifications of the aortic valve. Echocardiographic correlation for evaluation of potential valvular dysfunction may be warranted if clinically indicated. 4. Diffuse bronchial wall thickening with moderate centrilobular and paraseptal emphysema; imaging findings suggestive of underlying COPD. Aortic Atherosclerosis (ICD10-I70.0), Aortic aneurysm NOS (ICD10-I71.9) and Emphysema (ICD10-J43.9). Electronically Signed   By: Vinnie Langton M.D.   On: 06/13/2019 11:04    Assessment/Plan Active Problems:   Acute on chronic  respiratory failure with hypoxia (HCC)   Interstitial lung disease (HCC)   Lobar pneumonia, unspecified organism (HCC)   COPD, severe ([place])   1. Acute on chronic respiratory failure with hypoxia we will continue with high flow nasal cannula at 16 L and 50% FiO2.  Continue aggressive pulmonary toilet supportive measures.  Wean as tolerated. 2. Interstitial lung disease unclear etiology patient has been in the past treated with steroids she is also given antibiotics. Again would recommend getting a follow-up CT once she is more stable 3. Lobar pneumonia unspecified we will continue with present management patient has also been consulted with infectious disease will await their recommendations 4. Severe COPD nebulizers as necessary    I have personally seen and evaluated the patient, evaluated laboratory and imaging results, formulated the assessment and plan and placed orders. The Patient requires high complexity decision making for assessment and support.  Case was discussed on Rounds with the Respiratory Therapy Staff  Allyne Gee, MD [facility] Pulmonary Critical Care Medicine Sleep Medicine

## 2019-06-14 DIAGNOSIS — J181 Lobar pneumonia, unspecified organism: Secondary | ICD-10-CM | POA: Diagnosis not present

## 2019-06-14 DIAGNOSIS — J449 Chronic obstructive pulmonary disease, unspecified: Secondary | ICD-10-CM | POA: Diagnosis not present

## 2019-06-14 DIAGNOSIS — J9621 Acute and chronic respiratory failure with hypoxia: Secondary | ICD-10-CM | POA: Diagnosis not present

## 2019-06-14 DIAGNOSIS — J849 Interstitial pulmonary disease, unspecified: Secondary | ICD-10-CM | POA: Diagnosis not present

## 2019-06-14 LAB — BASIC METABOLIC PANEL
Anion gap: 10 (ref 5–15)
BUN: 19 mg/dL (ref 8–23)
CO2: 29 mmol/L (ref 22–32)
Calcium: 8.5 mg/dL — ABNORMAL LOW (ref 8.9–10.3)
Chloride: 100 mmol/L (ref 98–111)
Creatinine, Ser: 0.53 mg/dL (ref 0.44–1.00)
GFR calc Af Amer: >60 mL/min (ref >60)
GFR calc non Af Amer: >60 mL/min (ref >60)
Glucose, Bld: 107 mg/dL — ABNORMAL HIGH (ref 70–99)
Potassium: 4.2 mmol/L (ref 3.5–5.1)
Sodium: 139 mmol/L (ref 135–145)

## 2019-06-14 LAB — CBC
HCT: 39.4 % (ref 36.0–46.0)
Hemoglobin: 12.4 g/dL (ref 12.0–15.0)
MCH: 25 pg — ABNORMAL LOW (ref 26.0–34.0)
MCHC: 31.5 g/dL (ref 30.0–36.0)
MCV: 79.4 fL — ABNORMAL LOW (ref 80.0–100.0)
Platelets: 299 10*3/uL (ref 150–400)
RBC: 4.96 MIL/uL (ref 3.87–5.11)
RDW: 18.3 % — ABNORMAL HIGH (ref 11.5–15.5)
WBC: 14.4 10*3/uL — ABNORMAL HIGH (ref 4.0–10.5)
nRBC: 0 % (ref 0.0–0.2)

## 2019-06-14 LAB — PHOSPHORUS: Phosphorus: 3.6 mg/dL (ref 2.5–4.6)

## 2019-06-14 LAB — MAGNESIUM: Magnesium: 2.2 mg/dL (ref 1.7–2.4)

## 2019-06-14 NOTE — Progress Notes (Addendum)
Pulmonary Critical Care Medicine [facility] [place]   PULMONARY CRITICAL CARE SERVICE  PROGRESS NOTE  Date of Service: 06/14/2019  Amethyst Gainer  OQH:[id number]  DOB: 02-15-1941   DOA: 06/07/2019  Referring Physician: Carron Curie, MD  HPI: Rylann Munford is a 78 y.o. female seen for follow up of Acute on Chronic Respiratory Failure.  Patient remains on heated high flow nasal cannula 16 L and 40% FiO2.  Satting well no distress.  Medications: Reviewed on Rounds  Physical Exam:  Vitals: Pulse 81 respirations 22 BP 130/68 O2 sat 97% 7.5  Ventilator Settings heated high flow 16 L and 40% FiO2  . General: Comfortable at this time . Eyes: Grossly normal lids, irises & conjunctiva . ENT: grossly tongue is normal . Neck: no obvious mass . Cardiovascular: S1 S2 normal no gallop . Respiratory: No rales or rhonchi noted . Abdomen: soft . Skin: no rash seen on limited exam . Musculoskeletal: not rigid . Psychiatric:unable to assess . Neurologic: no seizure no involuntary movements         Lab Data:   Basic Metabolic Panel: Recent Labs  Lab 06/08/19 0734 06/09/19 0526 06/10/19 0652 06/11/19 0822 06/14/19 0717  NA 138 137 135 139 139  K 3.6 3.7 4.1 3.9 4.2  CL 99 98 97* 99 100  CO2 28 28 29  32 29  GLUCOSE 75 96 132* 119* 107*  BUN 14 12 12 15 19   CREATININE 0.64 0.41* 0.46 0.51 0.53  CALCIUM 8.7* 8.3* 8.4* 8.7* 8.5*  MG 1.9 1.9 2.1 2.0 2.2  PHOS 2.9 3.1  --  3.4 3.6    ABG: No results for input(s): PHART, PCO2ART, PO2ART, HCO3, O2SAT in the last 168 hours.  Liver Function Tests: Recent Labs  Lab 06/08/19 0734  AST 17  ALT 12  ALKPHOS 64  BILITOT 0.7  PROT 5.7*  ALBUMIN 2.5*   No results for input(s): LIPASE, AMYLASE in the last 168 hours. No results for input(s): AMMONIA in the last 168 hours.  CBC: Recent Labs  Lab 06/08/19 0734 06/09/19 0526 06/11/19 0822 06/14/19 0717  WBC 19.8* 19.7* 18.1* 14.4*  NEUTROABS 15.4*  --   --   --   HGB  13.7 11.6* 11.9* 12.4  HCT 43.1 35.5* 38.2 39.4  MCV 78.2* 78.2* 79.1* 79.4*  PLT 267 235 275 299    Cardiac Enzymes: No results for input(s): CKTOTAL, CKMB, CKMBINDEX, TROPONINI in the last 168 hours.  BNP (last 3 results) No results for input(s): BNP in the last 8760 hours.  ProBNP (last 3 results) No results for input(s): PROBNP in the last 8760 hours.  Radiological Exams: Ct Chest High Resolution  Result Date: 06/13/2019 CLINICAL DATA:  78 year old female with clinical concern for interstitial lung disease. EXAM: CT CHEST WITHOUT CONTRAST TECHNIQUE: Multidetector CT imaging of the chest was performed following the standard protocol without intravenous contrast. High resolution imaging of the lungs, as well as inspiratory and expiratory imaging, was performed. COMPARISON:  No priors. FINDINGS: Cardiovascular: Heart size is normal. There is no significant pericardial fluid, thickening or pericardial calcification. There is aortic atherosclerosis, as well as atherosclerosis of the great vessels of the mediastinum and the coronary arteries, including calcified atherosclerotic plaque in the left circumflex and right coronary arteries. Severe calcifications of the mitral annulus. Aneurysmal dilatation of the ascending thoracic aorta (4.5 cm in diameter). Mediastinum/Nodes: No pathologically enlarged mediastinal or hilar lymph nodes. Please note that accurate exclusion of hilar adenopathy is limited on noncontrast CT scans.  Esophagus is unremarkable in appearance. No axillary lymphadenopathy. Lungs/Pleura: Study is limited by extensive patient respiratory motion and areas of airspace consolidation in the lung bases bilaterally. With these limitations in mind, high-resolution images demonstrate diffuse bronchial wall thickening with moderate centrilobular and paraseptal emphysema. There are areas of septal thickening most evident throughout the mid to lower lungs bilaterally, however, this is  confounded by apparent acute airspace consolidation. Some areas appear to reflect honeycombing, however, this may simply represent emphysema with superimposed septal thickening. Inspiratory and expiratory imaging is unremarkable. Airspace consolidation is most evident in the lower lobes of the lungs bilaterally. No pleural effusions. No definite suspicious appearing pulmonary nodules or masses are noted. Upper Abdomen: Aortic atherosclerosis. Musculoskeletal: There are no aggressive appearing lytic or blastic lesions noted in the visualized portions of the skeleton. IMPRESSION: 1. Study is essentially nondiagnostic for accurate assessment of interstitial lung disease in the setting of acute infection (bilateral lower lobe pneumonia). If there is persistent clinical concern for interstitial lung disease, the patient should be reimaged with high-resolution chest CT in 3-6 months after complete resolution of the pneumonia to better evaluate potential interstitial lung disease. 2. Aortic atherosclerosis, in addition to 2 vessel coronary artery disease. In addition, there is mild aneurysmal dilatation of the ascending thoracic aorta (4.5 cm in diameter). Ascending thoracic aortic aneurysm. Recommend semi-annual imaging followup by CTA or MRA and referral to cardiothoracic surgery if not already obtained. This recommendation follows 2010 ACCF/AHA/AATS/ACR/ASA/SCA/SCAI/SIR/STS/SVM Guidelines for the Diagnosis and Management of Patients With Thoracic Aortic Disease. Circulation. 2010; 121: V494-W967. Aortic aneurysm NOS (ICD10-I71.9). 3. There are calcifications of the aortic valve. Echocardiographic correlation for evaluation of potential valvular dysfunction may be warranted if clinically indicated. 4. Diffuse bronchial wall thickening with moderate centrilobular and paraseptal emphysema; imaging findings suggestive of underlying COPD. Aortic Atherosclerosis (ICD10-I70.0), Aortic aneurysm NOS (ICD10-I71.9) and Emphysema  (ICD10-J43.9). Electronically Signed   By: Vinnie Langton M.D.   On: 06/13/2019 11:04    Assessment/Plan Active Problems:   Acute on chronic respiratory failure with hypoxia (HCC)   Interstitial lung disease (HCC)   Lobar pneumonia, unspecified organism (HCC)   COPD, severe ([place])   1. Acute on chronic respiratory failure with hypoxia we will continue with high flow nasal cannula at 16 L and 40% FiO2.  Continue aggressive pulmonary toilet supportive measures.  Wean as tolerated. 2. Interstitial lung disease unclear etiology patient has been in the past treated with steroids she is also given antibiotics. Again would recommend getting a follow-up CT once she is more stable 3. Lobar pneumonia unspecified we will continue with present management patient has also been consulted with infectious disease will await their recommendations 4. Severe COPD nebulizers as necessary   I have personally seen and evaluated the patient, evaluated laboratory and imaging results, formulated the assessment and plan and placed orders. The Patient requires high complexity decision making for assessment and support.  Case was discussed on Rounds with the Respiratory Therapy Staff  Allyne Gee, MD [facility] Pulmonary Critical Care Medicine Sleep Medicine

## 2019-06-15 DIAGNOSIS — J181 Lobar pneumonia, unspecified organism: Secondary | ICD-10-CM | POA: Diagnosis not present

## 2019-06-15 DIAGNOSIS — J849 Interstitial pulmonary disease, unspecified: Secondary | ICD-10-CM | POA: Diagnosis not present

## 2019-06-15 DIAGNOSIS — J9621 Acute and chronic respiratory failure with hypoxia: Secondary | ICD-10-CM | POA: Diagnosis not present

## 2019-06-15 DIAGNOSIS — J449 Chronic obstructive pulmonary disease, unspecified: Secondary | ICD-10-CM | POA: Diagnosis not present

## 2019-06-15 LAB — BASIC METABOLIC PANEL
Anion gap: 8 (ref 5–15)
BUN: 15 mg/dL (ref 8–23)
CO2: 28 mmol/L (ref 22–32)
Calcium: 8.4 mg/dL — ABNORMAL LOW (ref 8.9–10.3)
Chloride: 102 mmol/L (ref 98–111)
Creatinine, Ser: 0.5 mg/dL (ref 0.44–1.00)
GFR calc Af Amer: >60 mL/min (ref >60)
GFR calc non Af Amer: >60 mL/min (ref >60)
Glucose, Bld: 123 mg/dL — ABNORMAL HIGH (ref 70–99)
Potassium: 4.4 mmol/L (ref 3.5–5.1)
Sodium: 138 mmol/L (ref 135–145)

## 2019-06-15 LAB — CBC
HCT: 41.1 % (ref 36.0–46.0)
Hemoglobin: 12.8 g/dL (ref 12.0–15.0)
MCH: 24.7 pg — ABNORMAL LOW (ref 26.0–34.0)
MCHC: 31.1 g/dL (ref 30.0–36.0)
MCV: 79.2 fL — ABNORMAL LOW (ref 80.0–100.0)
Platelets: 302 10*3/uL (ref 150–400)
RBC: 5.19 MIL/uL — ABNORMAL HIGH (ref 3.87–5.11)
RDW: 18.2 % — ABNORMAL HIGH (ref 11.5–15.5)
WBC: 17.2 10*3/uL — ABNORMAL HIGH (ref 4.0–10.5)
nRBC: 0 % (ref 0.0–0.2)

## 2019-06-15 LAB — MAGNESIUM: Magnesium: 2.1 mg/dL (ref 1.7–2.4)

## 2019-06-15 LAB — PHOSPHORUS: Phosphorus: 3.6 mg/dL (ref 2.5–4.6)

## 2019-06-15 NOTE — Progress Notes (Signed)
Pulmonary Critical Care Medicine [facility] [place]   PULMONARY CRITICAL CARE SERVICE  PROGRESS NOTE  Date of Service: 06/15/2019  Kara Alvarez  LPF:[id number]  DOB: May 10, 1941   DOA: 06/07/2019  Referring Physician: Carron Curie, MD  HPI: Kara Alvarez is a 78 y.o. female seen for follow up of Acute on Chronic Respiratory Failure.  Oxygen requirements are slowly coming down patient now is on 14 L high flow with 40% FiO2 we will try Oxymizer today  Medications: Reviewed on Rounds  Physical Exam:  Vitals: Temperature 98.0 pulse 87 Story 25 blood pressure 109/61 saturations 94%  Ventilator Settings patient is on high flow nasal cannula 14 L with an FiO2 of 40%  . General: Comfortable at this time . Eyes: Grossly normal lids, irises & conjunctiva . ENT: grossly tongue is normal . Neck: no obvious mass . Cardiovascular: S1 S2 normal no gallop . Respiratory: No rhonchi no rales are noted at this time . Abdomen: soft . Skin: no rash seen on limited exam . Musculoskeletal: not rigid . Psychiatric:unable to assess . Neurologic: no seizure no involuntary movements         Lab Data:   Basic Metabolic Panel: Recent Labs  Lab 06/09/19 0526 06/10/19 0652 06/11/19 0822 06/14/19 0717 06/15/19 0746  NA 137 135 139 139 138  K 3.7 4.1 3.9 4.2 4.4  CL 98 97* 99 100 102  CO2 28 29 32 29 28  GLUCOSE 96 132* 119* 107* 123*  BUN 12 12 15 19 15   CREATININE 0.41* 0.46 0.51 0.53 0.50  CALCIUM 8.3* 8.4* 8.7* 8.5* 8.4*  MG 1.9 2.1 2.0 2.2 2.1  PHOS 3.1  --  3.4 3.6 3.6    ABG: No results for input(s): PHART, PCO2ART, PO2ART, HCO3, O2SAT in the last 168 hours.  Liver Function Tests: No results for input(s): AST, ALT, ALKPHOS, BILITOT, PROT, ALBUMIN in the last 168 hours. No results for input(s): LIPASE, AMYLASE in the last 168 hours. No results for input(s): AMMONIA in the last 168 hours.  CBC: Recent Labs  Lab 06/09/19 0526 06/11/19 0822 06/14/19 0717  06/15/19 0746  WBC 19.7* 18.1* 14.4* 17.2*  HGB 11.6* 11.9* 12.4 12.8  HCT 35.5* 38.2 39.4 41.1  MCV 78.2* 79.1* 79.4* 79.2*  PLT 235 275 299 302    Cardiac Enzymes: No results for input(s): CKTOTAL, CKMB, CKMBINDEX, TROPONINI in the last 168 hours.  BNP (last 3 results) No results for input(s): BNP in the last 8760 hours.  ProBNP (last 3 results) No results for input(s): PROBNP in the last 8760 hours.  Radiological Exams: Ct Chest High Resolution  Result Date: 06/13/2019 CLINICAL DATA:  78 year old female with clinical concern for interstitial lung disease. EXAM: CT CHEST WITHOUT CONTRAST TECHNIQUE: Multidetector CT imaging of the chest was performed following the standard protocol without intravenous contrast. High resolution imaging of the lungs, as well as inspiratory and expiratory imaging, was performed. COMPARISON:  No priors. FINDINGS: Cardiovascular: Heart size is normal. There is no significant pericardial fluid, thickening or pericardial calcification. There is aortic atherosclerosis, as well as atherosclerosis of the great vessels of the mediastinum and the coronary arteries, including calcified atherosclerotic plaque in the left circumflex and right coronary arteries. Severe calcifications of the mitral annulus. Aneurysmal dilatation of the ascending thoracic aorta (4.5 cm in diameter). Mediastinum/Nodes: No pathologically enlarged mediastinal or hilar lymph nodes. Please note that accurate exclusion of hilar adenopathy is limited on noncontrast CT scans. Esophagus is unremarkable in appearance. No axillary lymphadenopathy.  Lungs/Pleura: Study is limited by extensive patient respiratory motion and areas of airspace consolidation in the lung bases bilaterally. With these limitations in mind, high-resolution images demonstrate diffuse bronchial wall thickening with moderate centrilobular and paraseptal emphysema. There are areas of septal thickening most evident throughout the mid to  lower lungs bilaterally, however, this is confounded by apparent acute airspace consolidation. Some areas appear to reflect honeycombing, however, this may simply represent emphysema with superimposed septal thickening. Inspiratory and expiratory imaging is unremarkable. Airspace consolidation is most evident in the lower lobes of the lungs bilaterally. No pleural effusions. No definite suspicious appearing pulmonary nodules or masses are noted. Upper Abdomen: Aortic atherosclerosis. Musculoskeletal: There are no aggressive appearing lytic or blastic lesions noted in the visualized portions of the skeleton. IMPRESSION: 1. Study is essentially nondiagnostic for accurate assessment of interstitial lung disease in the setting of acute infection (bilateral lower lobe pneumonia). If there is persistent clinical concern for interstitial lung disease, the patient should be reimaged with high-resolution chest CT in 3-6 months after complete resolution of the pneumonia to better evaluate potential interstitial lung disease. 2. Aortic atherosclerosis, in addition to 2 vessel coronary artery disease. In addition, there is mild aneurysmal dilatation of the ascending thoracic aorta (4.5 cm in diameter). Ascending thoracic aortic aneurysm. Recommend semi-annual imaging followup by CTA or MRA and referral to cardiothoracic surgery if not already obtained. This recommendation follows 2010 ACCF/AHA/AATS/ACR/ASA/SCA/SCAI/SIR/STS/SVM Guidelines for the Diagnosis and Management of Patients With Thoracic Aortic Disease. Circulation. 2010; 121: V616-W737. Aortic aneurysm NOS (ICD10-I71.9). 3. There are calcifications of the aortic valve. Echocardiographic correlation for evaluation of potential valvular dysfunction may be warranted if clinically indicated. 4. Diffuse bronchial wall thickening with moderate centrilobular and paraseptal emphysema; imaging findings suggestive of underlying COPD. Aortic Atherosclerosis (ICD10-I70.0), Aortic  aneurysm NOS (ICD10-I71.9) and Emphysema (ICD10-J43.9). Electronically Signed   By: Vinnie Langton M.D.   On: 06/13/2019 11:04    Assessment/Plan Active Problems:   Acute on chronic respiratory failure with hypoxia (HCC)   Interstitial lung disease (HCC)   Lobar pneumonia, unspecified organism (HCC)   COPD, severe ([place])   1. Acute on chronic respiratory failure with hypoxia we will continue with the Oxymizer trial today if patient is able to tolerate 2. Interstitial lung disease advanced disease we will continue with supportive care CT scan results as noted above 3. Lobar pneumonia treated we will continue to follow along 4. Severe COPD at baseline   I have personally seen and evaluated the patient, evaluated laboratory and imaging results, formulated the assessment and plan and placed orders. The Patient requires high complexity decision making for assessment and support.  Case was discussed on Rounds with the Respiratory Therapy Staff  Allyne Gee, MD [facility] Pulmonary Critical Care Medicine Sleep Medicine

## 2019-06-16 DIAGNOSIS — J9621 Acute and chronic respiratory failure with hypoxia: Secondary | ICD-10-CM | POA: Diagnosis not present

## 2019-06-16 DIAGNOSIS — J449 Chronic obstructive pulmonary disease, unspecified: Secondary | ICD-10-CM | POA: Diagnosis not present

## 2019-06-16 DIAGNOSIS — J181 Lobar pneumonia, unspecified organism: Secondary | ICD-10-CM | POA: Diagnosis not present

## 2019-06-16 DIAGNOSIS — J849 Interstitial pulmonary disease, unspecified: Secondary | ICD-10-CM | POA: Diagnosis not present

## 2019-06-16 NOTE — Progress Notes (Signed)
Pulmonary Critical Care Medicine [place]   PULMONARY CRITICAL CARE SERVICE  PROGRESS NOTE  Date of Service: 06/16/2019  Kara Alvarez  EHU:[id number]  DOB: May 20, 1941   DOA: 06/07/2019  Referring Physician: Merton Border, MD  HPI: Kara Alvarez is a 78 y.o. female seen for follow up of Acute on Chronic Respiratory Failure.  She is actually doing well has been switched over to Oxymizer and currently is requiring 5 L with excellent saturations  Medications: Reviewed on Rounds  Physical Exam:  Vitals: Temperature 97.0 pulse 97 respiratory 24 blood pressure 105/60 saturations 97%  Ventilator Settings off the high flow now on Oxymizer 5 L  . General: Comfortable at this time . Eyes: Grossly normal lids, irises & conjunctiva . ENT: grossly tongue is normal . Neck: no obvious mass . Cardiovascular: S1 S2 normal no gallop . Respiratory: No rhonchi coarse breath sounds . Abdomen: soft . Skin: no rash seen on limited exam . Musculoskeletal: not rigid . Psychiatric:unable to assess . Neurologic: no seizure no involuntary movements         Lab Data:   Basic Metabolic Panel: Recent Labs  Lab 06/10/19 0652 06/11/19 0822 06/14/19 0717 06/15/19 0746  NA 135 139 139 138  K 4.1 3.9 4.2 4.4  CL 97* 99 100 102  CO2 29 32 29 28  GLUCOSE 132* 119* 107* 123*  BUN 12 15 19 15   CREATININE 0.46 0.51 0.53 0.50  CALCIUM 8.4* 8.7* 8.5* 8.4*  MG 2.1 2.0 2.2 2.1  PHOS  --  3.4 3.6 3.6    ABG: No results for input(s): PHART, PCO2ART, PO2ART, HCO3, O2SAT in the last 168 hours.  Liver Function Tests: No results for input(s): AST, ALT, ALKPHOS, BILITOT, PROT, ALBUMIN in the last 168 hours. No results for input(s): LIPASE, AMYLASE in the last 168 hours. No results for input(s): AMMONIA in the last 168 hours.  CBC: Recent Labs  Lab 06/11/19 0822 06/14/19 0717 06/15/19 0746  WBC 18.1* 14.4* 17.2*  HGB 11.9* 12.4 12.8  HCT 38.2 39.4 41.1  MCV 79.1* 79.4* 79.2*   PLT 275 299 302    Cardiac Enzymes: No results for input(s): CKTOTAL, CKMB, CKMBINDEX, TROPONINI in the last 168 hours.  BNP (last 3 results) No results for input(s): BNP in the last 8760 hours.  ProBNP (last 3 results) No results for input(s): PROBNP in the last 8760 hours.  Radiological Exams: No results found.  Assessment/Plan Active Problems:   Acute on chronic respiratory failure with hypoxia (HCC)   Interstitial lung disease (HCC)   Lobar pneumonia, unspecified organism (HCC)   COPD, severe ([employer])   1. Acute on chronic respiratory failure with hypoxia plan is to continue with Oxymizer as ordered titrate down we will see how she does once she works with physical therapy 2. Interstitial lung disease advanced disease we will need work-up after discharge further 3. Lobar pneumonia treated clinically improving 4. Severe COPD at baseline we will continue to follow   I have personally seen and evaluated the patient, evaluated laboratory and imaging results, formulated the assessment and plan and placed orders. The Patient requires high complexity decision making for assessment and support.  Case was discussed on Rounds with the Respiratory Therapy Staff  Allyne Gee, MD [facility] Pulmonary Critical Care Medicine Sleep Medicine

## 2019-06-17 DIAGNOSIS — J9621 Acute and chronic respiratory failure with hypoxia: Secondary | ICD-10-CM | POA: Diagnosis not present

## 2019-06-17 DIAGNOSIS — J181 Lobar pneumonia, unspecified organism: Secondary | ICD-10-CM | POA: Diagnosis not present

## 2019-06-17 DIAGNOSIS — J849 Interstitial pulmonary disease, unspecified: Secondary | ICD-10-CM | POA: Diagnosis not present

## 2019-06-17 DIAGNOSIS — J449 Chronic obstructive pulmonary disease, unspecified: Secondary | ICD-10-CM | POA: Diagnosis not present

## 2019-06-17 NOTE — Progress Notes (Signed)
Pulmonary Critical Care Medicine [place]   PULMONARY CRITICAL CARE SERVICE  PROGRESS NOTE  Date of Service: 06/17/2019  Kara Alvarez  MVH:[id number]  DOB: [birth year]   DOA: 06/07/2019  Referring Physician: Merton Border, MD  HPI: Kara Alvarez is a 78 y.o. female seen for follow up of Acute on Chronic Respiratory Failure.  Patient currently is on 3 L oxygen using nasal cannula without any distress  Medications: Reviewed on Rounds  Physical Exam:  Vitals: Temperature 96.8 pulse 93 respiratory rate 20 blood pressure 112/68 saturations 98%  Ventilator Settings currently is on 3 L weaning successfully  . General: Comfortable at this time . Eyes: Grossly normal lids, irises & conjunctiva . ENT: grossly tongue is normal . Neck: no obvious mass . Cardiovascular: S1 S2 normal no gallop . Respiratory: No rhonchi no rales are noted at this time . Abdomen: soft . Skin: no rash seen on limited exam . Musculoskeletal: not rigid . Psychiatric:unable to assess . Neurologic: no seizure no involuntary movements         Lab Data:   Basic Metabolic Panel: Recent Labs  Lab 06/11/19 0822 06/14/19 0717 06/15/19 0746  NA 139 139 138  K 3.9 4.2 4.4  CL 99 100 102  CO2 32 29 28  GLUCOSE 119* 107* 123*  BUN 15 19 15   CREATININE 0.51 0.53 0.50  CALCIUM 8.7* 8.5* 8.4*  MG 2.0 2.2 2.1  PHOS 3.4 3.6 3.6    ABG: No results for input(s): PHART, PCO2ART, PO2ART, HCO3, O2SAT in the last 168 hours.  Liver Function Tests: No results for input(s): AST, ALT, ALKPHOS, BILITOT, PROT, ALBUMIN in the last 168 hours. No results for input(s): LIPASE, AMYLASE in the last 168 hours. No results for input(s): AMMONIA in the last 168 hours.  CBC: Recent Labs  Lab 06/11/19 0822 06/14/19 0717 06/15/19 0746  WBC 18.1* 14.4* 17.2*  HGB 11.9* 12.4 12.8  HCT 38.2 39.4 41.1  MCV 79.1* 79.4* 79.2*  PLT 275 299 302    Cardiac Enzymes: No results for input(s): CKTOTAL, CKMB,  CKMBINDEX, TROPONINI in the last 168 hours.  BNP (last 3 results) No results for input(s): BNP in the last 8760 hours.  ProBNP (last 3 results) No results for input(s): PROBNP in the last 8760 hours.  Radiological Exams: No results found.  Assessment/Plan Active Problems:   Acute on chronic respiratory failure with hypoxia (HCC)   Interstitial lung disease (HCC)   Lobar pneumonia, unspecified organism (HCC)   COPD, severe ([place])   1. Acute on chronic respiratory failure with hypoxia we will continue with nasal cannula 2. Interstitial lung disease will need follow-up after discharge 3. Lobar pneumonia treated 4. Severe COPD at baseline continue present management   I have personally seen and evaluated the patient, evaluated laboratory and imaging results, formulated the assessment and plan and placed orders. The Patient requires high complexity decision making for assessment and support.  Case was discussed on Rounds with the Respiratory Therapy Staff  Allyne Gee, MD [facility] Pulmonary Critical Care Medicine Sleep Medicine

## 2019-06-18 DIAGNOSIS — J449 Chronic obstructive pulmonary disease, unspecified: Secondary | ICD-10-CM | POA: Diagnosis not present

## 2019-06-18 DIAGNOSIS — J849 Interstitial pulmonary disease, unspecified: Secondary | ICD-10-CM | POA: Diagnosis not present

## 2019-06-18 DIAGNOSIS — J181 Lobar pneumonia, unspecified organism: Secondary | ICD-10-CM | POA: Diagnosis not present

## 2019-06-18 DIAGNOSIS — J9621 Acute and chronic respiratory failure with hypoxia: Secondary | ICD-10-CM | POA: Diagnosis not present

## 2019-06-18 NOTE — Progress Notes (Signed)
Pulmonary Critical Care Medicine [place]   PULMONARY CRITICAL CARE SERVICE  PROGRESS NOTE  Date of Service: 06/18/2019  Kara Alvarez  FBP:[id number]  DOB: [birth year]   DOA: 06/07/2019  Referring Physician: Merton Border, MD  HPI: Kara Alvarez is a 78 y.o. female seen for follow up of Acute on Chronic Respiratory Failure.  She is maintaining good saturations on 4 L oxygen this is her baseline  Medications: Reviewed on Rounds  Physical Exam:  Vitals: Temperature 96.9 pulse 86 respiratory rate 20 blood pressure 114/78 saturations 100%  Ventilator Settings off the ventilator right now on oxygen 4 L  . General: Comfortable at this time . Eyes: Grossly normal lids, irises & conjunctiva . ENT: grossly tongue is normal . Neck: no obvious mass . Cardiovascular: S1 S2 normal no gallop . Respiratory: No rhonchi no rales are noted . Abdomen: soft . Skin: no rash seen on limited exam . Musculoskeletal: not rigid . Psychiatric:unable to assess . Neurologic: no seizure no involuntary movements         Lab Data:   Basic Metabolic Panel: Recent Labs  Lab 06/14/19 0717 06/15/19 0746  NA 139 138  K 4.2 4.4  CL 100 102  CO2 29 28  GLUCOSE 107* 123*  BUN 19 15  CREATININE 0.53 0.50  CALCIUM 8.5* 8.4*  MG 2.2 2.1  PHOS 3.6 3.6    ABG: No results for input(s): PHART, PCO2ART, PO2ART, HCO3, O2SAT in the last 168 hours.  Liver Function Tests: No results for input(s): AST, ALT, ALKPHOS, BILITOT, PROT, ALBUMIN in the last 168 hours. No results for input(s): LIPASE, AMYLASE in the last 168 hours. No results for input(s): AMMONIA in the last 168 hours.  CBC: Recent Labs  Lab 06/14/19 0717 06/15/19 0746  WBC 14.4* 17.2*  HGB 12.4 12.8  HCT 39.4 41.1  MCV 79.4* 79.2*  PLT 299 302    Cardiac Enzymes: No results for input(s): CKTOTAL, CKMB, CKMBINDEX, TROPONINI in the last 168 hours.  BNP (last 3 results) No results for input(s): BNP in the last  8760 hours.  ProBNP (last 3 results) No results for input(s): PROBNP in the last 8760 hours.  Radiological Exams: No results found.  Assessment/Plan Active Problems:   Acute on chronic respiratory failure with hypoxia (HCC)   Interstitial lung disease (HCC)   Lobar pneumonia, unspecified organism (HCC)   COPD, severe ([place])   1. Acute on chronic respiratory failure with hypoxia we will continue with oxygen therapy titrate as tolerated 2. Interstitial lung disease will need follow-up after discharge 3. Lobar pneumonia treated 4. Severe COPD at baseline continue with present management   I have personally seen and evaluated the patient, evaluated laboratory and imaging results, formulated the assessment and plan and placed orders. The Patient requires high complexity decision making for assessment and support.  Case was discussed on Rounds with the Respiratory Therapy Staff  Allyne Gee, MD [facility] Pulmonary Critical Care Medicine Sleep Medicine

## 2019-06-19 ENCOUNTER — Other Ambulatory Visit (HOSPITAL_COMMUNITY): Payer: Medicare Other

## 2019-06-19 DIAGNOSIS — J9621 Acute and chronic respiratory failure with hypoxia: Secondary | ICD-10-CM | POA: Diagnosis not present

## 2019-06-19 DIAGNOSIS — J449 Chronic obstructive pulmonary disease, unspecified: Secondary | ICD-10-CM | POA: Diagnosis not present

## 2019-06-19 DIAGNOSIS — J849 Interstitial pulmonary disease, unspecified: Secondary | ICD-10-CM | POA: Diagnosis not present

## 2019-06-19 DIAGNOSIS — J181 Lobar pneumonia, unspecified organism: Secondary | ICD-10-CM | POA: Diagnosis not present

## 2019-06-19 LAB — CBC
HCT: 38.1 % (ref 36.0–46.0)
Hemoglobin: 12.2 g/dL (ref 12.0–15.0)
MCH: 25.2 pg — ABNORMAL LOW (ref 26.0–34.0)
MCHC: 32 g/dL (ref 30.0–36.0)
MCV: 78.7 fL — ABNORMAL LOW (ref 80.0–100.0)
Platelets: 280 10*3/uL (ref 150–400)
RBC: 4.84 MIL/uL (ref 3.87–5.11)
RDW: 18.4 % — ABNORMAL HIGH (ref 11.5–15.5)
WBC: 26.7 10*3/uL — ABNORMAL HIGH (ref 4.0–10.5)
nRBC: 0 % (ref 0.0–0.2)

## 2019-06-19 LAB — BASIC METABOLIC PANEL
Anion gap: 8 (ref 5–15)
BUN: 31 mg/dL — ABNORMAL HIGH (ref 8–23)
CO2: 30 mmol/L (ref 22–32)
Calcium: 8.4 mg/dL — ABNORMAL LOW (ref 8.9–10.3)
Chloride: 100 mmol/L (ref 98–111)
Creatinine, Ser: 0.59 mg/dL (ref 0.44–1.00)
GFR calc Af Amer: >60 mL/min (ref >60)
GFR calc non Af Amer: >60 mL/min (ref >60)
Glucose, Bld: 87 mg/dL (ref 70–99)
Potassium: 3.9 mmol/L (ref 3.5–5.1)
Sodium: 138 mmol/L (ref 135–145)

## 2019-06-19 MED ORDER — GENERIC EXTERNAL MEDICATION
Status: DC
Start: ? — End: 2019-06-19

## 2019-06-19 NOTE — Progress Notes (Signed)
Pulmonary Critical Care Medicine [place]   PULMONARY CRITICAL CARE SERVICE  PROGRESS NOTE  Date of Service: 06/19/2019  Kara Alvarez  KDT:[id number]  DOB: 1941-04-17   DOA: 06/07/2019  Referring Physician: Merton Border, MD  HPI: Kara Alvarez is a 78 y.o. female seen for follow up of Acute on Chronic Respiratory Failure.  Patient currently is on 4 L oxygen with good saturations  Medications: Reviewed on Rounds  Physical Exam:  Vitals: Temperature 97.5 pulse 84 respiratory rate 20 blood pressure 103/55 saturations 100%  Ventilator Settings off the ventilator right now on 4 L oxygen  . General: Comfortable at this time . Eyes: Grossly normal lids, irises & conjunctiva . ENT: grossly tongue is normal . Neck: no obvious mass . Cardiovascular: S1 S2 normal no gallop . Respiratory: No rhonchi no rales are noted at this time . Abdomen: soft . Skin: no rash seen on limited exam . Musculoskeletal: not rigid . Psychiatric:unable to assess . Neurologic: no seizure no involuntary movements         Lab Data:   Basic Metabolic Panel: Recent Labs  Lab 06/14/19 0717 06/15/19 0746 06/19/19 0610  NA 139 138 138  K 4.2 4.4 3.9  CL 100 102 100  CO2 29 28 30   GLUCOSE 107* 123* 87  BUN 19 15 31*  CREATININE 0.53 0.50 0.59  CALCIUM 8.5* 8.4* 8.4*  MG 2.2 2.1  --   PHOS 3.6 3.6  --     ABG: No results for input(s): PHART, PCO2ART, PO2ART, HCO3, O2SAT in the last 168 hours.  Liver Function Tests: No results for input(s): AST, ALT, ALKPHOS, BILITOT, PROT, ALBUMIN in the last 168 hours. No results for input(s): LIPASE, AMYLASE in the last 168 hours. No results for input(s): AMMONIA in the last 168 hours.  CBC: Recent Labs  Lab 06/14/19 0717 06/15/19 0746 06/19/19 0610  WBC 14.4* 17.2* 26.7*  HGB 12.4 12.8 12.2  HCT 39.4 41.1 38.1  MCV 79.4* 79.2* 78.7*  PLT 299 302 280    Cardiac Enzymes: No results for input(s): CKTOTAL, CKMB, CKMBINDEX,  TROPONINI in the last 168 hours.  BNP (last 3 results) No results for input(s): BNP in the last 8760 hours.  ProBNP (last 3 results) No results for input(s): PROBNP in the last 8760 hours.  Radiological Exams: No results found.  Assessment/Plan Active Problems:   Acute on chronic respiratory failure with hypoxia (HCC)   Interstitial lung disease (HCC)   Lobar pneumonia, unspecified organism (HCC)   COPD, severe ([place])   1. Acute on chronic respiratory failure with hypoxia patient will be continued on oxygen therapy right now is on 4 L 2. Interstitial lung disease supportive care clinically improved 3. Lobar pneumonia clinically she is improved white count has increased we will repeat could possibly be the steroid effect 4. Severe COPD at baseline   I have personally seen and evaluated the patient, evaluated laboratory and imaging results, formulated the assessment and plan and placed orders. The Patient requires high complexity decision making for assessment and support.  Case was discussed on Rounds with the Respiratory Therapy Staff  Allyne Gee, MD [facility] Pulmonary Critical Care Medicine Sleep Medicine

## 2019-06-19 NOTE — Progress Notes (Signed)
PROGRESS NOTE    Kara Alvarez  IRW:[id number] DOB: 13-Mar-1941 DOA: 06/07/2019   Brief Narrative:  Kara Alvarez is an 78 y.o. female with history of COPD, emphysema, chronic hypoxemic respiratory failure, aortic aneurysm, atrial regurgitation, breast cancer that was hospitalized 9/19 through 10/1.  She returned the following day with worsening dyspnea.  At the time of admission she was in significant respiratory distress.  She was screened for Covid multiple times without any positive results.  At the time of arrival in the hospital she was found to have a PaO2 of 65 on 3 L of oxygen.  pH 7.5.  WBC count was elevated to 50,000 with 82% neutrophils.  Chest x-ray on 05/29/2019 showed emphysema with diffuse interstitial changes and fibrotic appearance with possible superimposed opacities suggestive of superimposed infection, suspected pneumonia.  She was started on IV vancomycin, cefepime and was given another course of azithromycin.  Blood cultures at the time of admission were negative.  MRSA PCR was negative.  Legionella antigen was negative.  Viral respiratory panel was also negative.  Acute on chronic hypoxemic respiratory failure was suspected secondary to COPD exacerbation, superimposed bacterial pneumonia.  There was also concern for interstitial lung disease.  She had reportedly failed outpatient treatment with Levaquin.  Infectious disease was consulted.  Patient did require intubation but was ultimately extubated and currently on oxygen by nasal cannula.  Antibiotics were later switched to meropenem.  BAL cultures did not show anything significant.  However, given the severity of her illness infectious disease recommended to continue treatment with meropenem for another 1 to 2 weeks.  There is also some concern for CT changes reflecting recurrent malignancy with history of breast cancer and lymphangitic spread.  She is currently on oxygen by nasal cannula.  She denies having any chest pain at this  time.  She denies having any cough or fevers. She still has elevated WBC count but afebrile. On steroids.   Assessment & Plan:   Active Problems:   Acute on chronic respiratory failure with hypoxia (HCC)   Interstitial lung disease (HCC)   Lobar pneumonia, unspecified organism (HCC)   COPD, severe (HCC) Leukocytosis Interstitial lung disease History of breast cancer Hypertension Protein calorie malnutrition Debility  Acute on chronic hypoxemic respiratory failure: Multifactorial.  Likely secondary to severe COPD with exacerbation.  However, patient also noted to have diffuse interstitial changes could be secondary to pneumonia but could also be interstitial lung disease with interstitial fibrosis. She had chest CT on 10/17 which showed areas of airspace consolidation in the lung bases bilaterally. High-resolution images demonstrate diffuse bronchial wall thickening with moderate centrilobular and paraseptal emphysema. She remains on O2 nasal canula. Received treatment with IV meropenem. On steroids per pulmonary.   Bilateral pneumonia: Patient was previously on azithromycin at the outside hospital.  She failed outpatient treatment with Levaquin.  Her respiratory status continued to worsen therefore she was initiated on IV meropenem at outside facility.  She seems to be responding to the IV meropenem. Recenlty completed treatment with meropenem.  However, there may be some component of interstitial lung disease as well, as mentioned above which could be contributing to the respiratory failure.  If she starts having any fevers greater than 101 would recommend to send for pan cultures. She denies cough or sputum production at this time.  Severe COPD with exacerbation: Patient currently on steroids.  Pulmonary also following.  Continue management per primary team and pulmonary.    Leukocytosis: Likely secondary to the steroids?  Recently received antibiotics.  Currently afebrile, denies cough or  sputum. Denies diarrhea. Continue to monitor with steroid taper.  History of breast cancer: There was some concern for progression of her breast cancer and also lymphangitic spread per the discharge notes from the outside facility.  Per oncology they advised supportive care. Per outside Oncology, when able, and if she decides to pursue evaluation and treatment she would need CT chest abdomen and pelvis and bone scan with biopsy if any abnormality.  Protein calorie malnutrition: Management per the primary team.  Debility: Continue supportive management and therapy per the primary team.  Due to her complex medical problems she is a high risk for worsening and decompensation.   Subjective: She is on O2 nasal canula. Still having some shortness of breath but denies cough or sputums production.  Objective: Vitals: Temperature 98, pulse 94, respiratory 20, blood pressure 123/63, pulse oximetry 99% on oxygen nasal cannula.  Examination: General exam: Thin appearing female, oriented x3, no acute distress HEENT: Atraumatic, normocephalic, pupils equal and reactive, no urinary lesions. Respiratory system: decreased breath sounds lower lobes, few crackles, no rhonchi or wheezing  Cardiovascular system: S1 & S2 heard, RRR. No JVD, murmurs, rubs, gallops or clicks. No pedal edema. Gastrointestinal system: Abdomen is nondistended, soft and nontender. No organomegaly or masses felt. Normal bowel sounds heard. Central nervous system: Alert and oriented. No focal neurological deficits. Extremities: No edema. Skin: No rashes, lesions or ulcers Psychiatry: Judgement and insight appear normal. Mood & affect appropriate.     Data Reviewed: I have personally reviewed following labs and imaging studies  CBC: Recent Labs  Lab 06/14/19 0717 06/15/19 0746 06/19/19 0610  WBC 14.4* 17.2* 26.7*  HGB 12.4 12.8 12.2  HCT 39.4 41.1 38.1  MCV 79.4* 79.2* 78.7*  PLT 299 302 280   Basic Metabolic  Panel: Recent Labs  Lab 06/14/19 0717 06/15/19 0746 06/19/19 0610  NA 139 138 138  K 4.2 4.4 3.9  CL 100 102 100  CO2 29 28 30   GLUCOSE 107* 123* 87  BUN 19 15 31*  CREATININE 0.53 0.50 0.59  CALCIUM 8.5* 8.4* 8.4*  MG 2.2 2.1  --   PHOS 3.6 3.6  --    GFR: CrCl cannot be calculated (Unknown ideal weight.). Liver Function Tests: No results for input(s): AST, ALT, ALKPHOS, BILITOT, PROT, ALBUMIN in the last 168 hours. No results for input(s): LIPASE, AMYLASE in the last 168 hours. No results for input(s): AMMONIA in the last 168 hours. Coagulation Profile: No results for input(s): INR, PROTIME in the last 168 hours. Cardiac Enzymes: No results for input(s): CKTOTAL, CKMB, CKMBINDEX, TROPONINI in the last 168 hours. BNP (last 3 results) No results for input(s): PROBNP in the last 8760 hours. HbA1C: No results for input(s): HGBA1C in the last 72 hours. CBG: No results for input(s): GLUCAP in the last 168 hours. Lipid Profile: No results for input(s): CHOL, HDL, LDLCALC, TRIG, CHOLHDL, LDLDIRECT in the last 72 hours. Thyroid Function Tests: No results for input(s): TSH, T4TOTAL, FREET4, T3FREE, THYROIDAB in the last 72 hours. Anemia Panel: No results for input(s): VITAMINB12, FOLATE, FERRITIN, TIBC, IRON, RETICCTPCT in the last 72 hours. Sepsis Labs: No results for input(s): PROCALCITON, LATICACIDVEN in the last 168 hours.  No results found for this or any previous visit (from the past 240 hour(s)).       Radiology Studies: No results found.    Scheduled Meds:    , MD 06/19/2019, 3:36 PM

## 2019-06-20 ENCOUNTER — Institutional Professional Consult (permissible substitution) (HOSPITAL_COMMUNITY): Payer: Medicare Other

## 2019-06-20 ENCOUNTER — Other Ambulatory Visit (HOSPITAL_COMMUNITY): Payer: Medicare Other

## 2019-06-20 DIAGNOSIS — J449 Chronic obstructive pulmonary disease, unspecified: Secondary | ICD-10-CM | POA: Diagnosis not present

## 2019-06-20 DIAGNOSIS — J9621 Acute and chronic respiratory failure with hypoxia: Secondary | ICD-10-CM | POA: Diagnosis not present

## 2019-06-20 DIAGNOSIS — J849 Interstitial pulmonary disease, unspecified: Secondary | ICD-10-CM | POA: Diagnosis not present

## 2019-06-20 DIAGNOSIS — J181 Lobar pneumonia, unspecified organism: Secondary | ICD-10-CM | POA: Diagnosis not present

## 2019-06-20 NOTE — Progress Notes (Signed)
Pulmonary Critical Care Medicine [facility]   PULMONARY CRITICAL CARE SERVICE  PROGRESS NOTE  Date of Service: 06/20/2019  Kara Alvarez  [id number]:[id number]  DOB: 12-26-1940   DOA: 06/07/2019  Referring Physician: Merton Border, MD  HPI: Kara Alvarez is a 78 y.o. female seen for follow up of Acute on Chronic Respiratory Failure.  Patient is doing well at this time and now is on 4 L to 5 L which is basically her baseline.  Medications: Reviewed on Rounds  Physical Exam:  Vitals: Temperature 98.4 pulse 74 respiratory rate 20 blood pressure 124/67 saturations 100%  Ventilator Settings on currently 4 L  . General: Comfortable at this time . Eyes: Grossly normal lids, irises & conjunctiva . ENT: grossly tongue is normal . Neck: no obvious mass . Cardiovascular: S1 S2 normal no gallop . Respiratory: No rhonchi no rales . Abdomen: soft . Skin: no rash seen on limited exam . Musculoskeletal: not rigid . Psychiatric:unable to assess . Neurologic: no seizure no involuntary movements         Lab Data:   Basic Metabolic Panel: Recent Labs  Lab 06/14/19 0717 06/15/19 0746 06/19/19 0610  NA 139 138 138  K 4.2 4.4 3.9  CL 100 102 100  CO2 29 28 30   GLUCOSE 107* 123* 87  BUN 19 15 31*  CREATININE 0.53 0.50 0.59  CALCIUM 8.5* 8.4* 8.4*  MG 2.2 2.1  --   PHOS 3.6 3.6  --     ABG: No results for input(s): PHART, PCO2ART, PO2ART, HCO3, O2SAT in the last 168 hours.  Liver Function Tests: No results for input(s): AST, ALT, ALKPHOS, BILITOT, PROT, ALBUMIN in the last 168 hours. No results for input(s): LIPASE, AMYLASE in the last 168 hours. No results for input(s): AMMONIA in the last 168 hours.  CBC: Recent Labs  Lab 06/14/19 0717 06/15/19 0746 06/19/19 0610  WBC 14.4* 17.2* 26.7*  HGB 12.4 12.8 12.2  HCT 39.4 41.1 38.1  MCV 79.4* 79.2* 78.7*  PLT 299 302 280    Cardiac Enzymes: No results for input(s): CKTOTAL, CKMB, CKMBINDEX, TROPONINI in the  last 168 hours.  BNP (last 3 results) No results for input(s): BNP in the last 8760 hours.  ProBNP (last 3 results) No results for input(s): PROBNP in the last 8760 hours.  Radiological Exams: Dg Chest Port 1 View  Result Date: 06/19/2019 CLINICAL DATA:  Pneumonia EXAM: PORTABLE CHEST 1 VIEW COMPARISON:  06/09/2019 FINDINGS: There is significantly improved bilateral heterogeneous and interstitial airspace opacity superimposed upon emphysema. No new airspace opacity. Cardiomegaly. IMPRESSION: 1. There is significantly improved bilateral heterogeneous and interstitial airspace opacity superimposed upon emphysema, consistent with improved infection and/or edema. No new airspace opacity. 2.  Cardiomegaly. Electronically Signed   By: Eddie Candle M.D.   On: 06/19/2019 19:20    Assessment/Plan Active Problems:   Acute on chronic respiratory failure with hypoxia (HCC)   Interstitial lung disease (HCC)   Lobar pneumonia, unspecified organism (HCC)   COPD, severe ([place])   1. Acute on chronic respiratory failure with hypoxia plan is to continue with on supportive care and oxygen therapy.  Patient was also seen by infectious disease recommendations appreciated. 2. Interstitial lung disease at baseline will be continued with supportive care follow-up after discharge 3. Lobar pneumonia seen by infectious disease on antibiotics fever is down 4. Severe COPD we will continue with supportive care   I have personally seen and evaluated the patient, evaluated laboratory and imaging results, formulated  the assessment and plan and placed orders. The Patient requires high complexity decision making for assessment and support.  Case was discussed on Rounds with the Respiratory Therapy Staff  Allyne Gee, MD [facility] Pulmonary Critical Care Medicine Sleep Medicine

## 2019-06-21 LAB — CBC
HCT: 36.5 % (ref 36.0–46.0)
Hemoglobin: 11.9 g/dL — ABNORMAL LOW (ref 12.0–15.0)
MCH: 25.6 pg — ABNORMAL LOW (ref 26.0–34.0)
MCHC: 32.6 g/dL (ref 30.0–36.0)
MCV: 78.7 fL — ABNORMAL LOW (ref 80.0–100.0)
Platelets: 377 10*3/uL (ref 150–400)
RBC: 4.64 MIL/uL (ref 3.87–5.11)
RDW: 18.8 % — ABNORMAL HIGH (ref 11.5–15.5)
WBC: 27 10*3/uL — ABNORMAL HIGH (ref 4.0–10.5)
nRBC: 0 % (ref 0.0–0.2)

## 2019-06-23 LAB — CBC
HCT: 40.6 % (ref 36.0–46.0)
Hemoglobin: 12.8 g/dL (ref 12.0–15.0)
MCH: 24.9 pg — ABNORMAL LOW (ref 26.0–34.0)
MCHC: 31.5 g/dL (ref 30.0–36.0)
MCV: 79 fL — ABNORMAL LOW (ref 80.0–100.0)
Platelets: 489 10*3/uL — ABNORMAL HIGH (ref 150–400)
RBC: 5.14 MIL/uL — ABNORMAL HIGH (ref 3.87–5.11)
RDW: 19 % — ABNORMAL HIGH (ref 11.5–15.5)
WBC: 26.8 10*3/uL — ABNORMAL HIGH (ref 4.0–10.5)
nRBC: 0 % (ref 0.0–0.2)

## 2019-06-23 LAB — BASIC METABOLIC PANEL
Anion gap: 7 (ref 5–15)
BUN: 32 mg/dL — ABNORMAL HIGH (ref 8–23)
CO2: 33 mmol/L — ABNORMAL HIGH (ref 22–32)
Calcium: 9 mg/dL (ref 8.9–10.3)
Chloride: 98 mmol/L (ref 98–111)
Creatinine, Ser: 0.56 mg/dL (ref 0.44–1.00)
GFR calc Af Amer: >60 mL/min (ref >60)
GFR calc non Af Amer: >60 mL/min (ref >60)
Glucose, Bld: 86 mg/dL (ref 70–99)
Potassium: 3.7 mmol/L (ref 3.5–5.1)
Sodium: 138 mmol/L (ref 135–145)

## 2019-06-23 LAB — PHOSPHORUS: Phosphorus: 4.5 mg/dL (ref 2.5–4.6)

## 2019-06-23 LAB — MAGNESIUM: Magnesium: 2.1 mg/dL (ref 1.7–2.4)

## 2019-06-25 LAB — BASIC METABOLIC PANEL
Anion gap: 10 (ref 5–15)
BUN: 20 mg/dL (ref 8–23)
CO2: 29 mmol/L (ref 22–32)
Calcium: 8.5 mg/dL — ABNORMAL LOW (ref 8.9–10.3)
Chloride: 100 mmol/L (ref 98–111)
Creatinine, Ser: 0.57 mg/dL (ref 0.44–1.00)
GFR calc Af Amer: >60 mL/min (ref >60)
GFR calc non Af Amer: >60 mL/min (ref >60)
Glucose, Bld: 73 mg/dL (ref 70–99)
Potassium: 4 mmol/L (ref 3.5–5.1)
Sodium: 139 mmol/L (ref 135–145)

## 2019-06-25 LAB — CBC
HCT: 39.8 % (ref 36.0–46.0)
Hemoglobin: 12.4 g/dL (ref 12.0–15.0)
MCH: 25.2 pg — ABNORMAL LOW (ref 26.0–34.0)
MCHC: 31.2 g/dL (ref 30.0–36.0)
MCV: 80.7 fL (ref 80.0–100.0)
Platelets: 438 10*3/uL — ABNORMAL HIGH (ref 150–400)
RBC: 4.93 MIL/uL (ref 3.87–5.11)
RDW: 19.6 % — ABNORMAL HIGH (ref 11.5–15.5)
WBC: 27 10*3/uL — ABNORMAL HIGH (ref 4.0–10.5)
nRBC: 0 % (ref 0.0–0.2)

## 2019-06-25 LAB — PROCALCITONIN: Procalcitonin: <0.1 ng/mL

## 2019-06-25 LAB — MAGNESIUM: Magnesium: 2.2 mg/dL (ref 1.7–2.4)

## 2019-06-25 LAB — PHOSPHORUS: Phosphorus: 3.1 mg/dL (ref 2.5–4.6)

## 2019-06-26 LAB — CBC
HCT: 37.4 % (ref 36.0–46.0)
Hemoglobin: 11.8 g/dL — ABNORMAL LOW (ref 12.0–15.0)
MCH: 25.3 pg — ABNORMAL LOW (ref 26.0–34.0)
MCHC: 31.6 g/dL (ref 30.0–36.0)
MCV: 80.1 fL (ref 80.0–100.0)
Platelets: 412 10*3/uL — ABNORMAL HIGH (ref 150–400)
RBC: 4.67 MIL/uL (ref 3.87–5.11)
RDW: 19.5 % — ABNORMAL HIGH (ref 11.5–15.5)
WBC: 26.2 10*3/uL — ABNORMAL HIGH (ref 4.0–10.5)
nRBC: 0 % (ref 0.0–0.2)

## 2019-06-27 LAB — CBC
HCT: 40 % (ref 36.0–46.0)
Hemoglobin: 12.7 g/dL (ref 12.0–15.0)
MCH: 25.7 pg — ABNORMAL LOW (ref 26.0–34.0)
MCHC: 31.8 g/dL (ref 30.0–36.0)
MCV: 80.8 fL (ref 80.0–100.0)
Platelets: 346 10*3/uL (ref 150–400)
RBC: 4.95 MIL/uL (ref 3.87–5.11)
RDW: 20.6 % — ABNORMAL HIGH (ref 11.5–15.5)
WBC: 23.8 10*3/uL — ABNORMAL HIGH (ref 4.0–10.5)
nRBC: 0 % (ref 0.0–0.2)

## 2019-06-27 LAB — BASIC METABOLIC PANEL
Anion gap: 12 (ref 5–15)
BUN: 18 mg/dL (ref 8–23)
CO2: 28 mmol/L (ref 22–32)
Calcium: 8.6 mg/dL — ABNORMAL LOW (ref 8.9–10.3)
Chloride: 100 mmol/L (ref 98–111)
Creatinine, Ser: 0.5 mg/dL (ref 0.44–1.00)
GFR calc Af Amer: >60 mL/min (ref >60)
GFR calc non Af Amer: >60 mL/min (ref >60)
Glucose, Bld: 77 mg/dL (ref 70–99)
Potassium: 3.8 mmol/L (ref 3.5–5.1)
Sodium: 140 mmol/L (ref 135–145)

## 2019-06-27 LAB — MAGNESIUM: Magnesium: 2.2 mg/dL (ref 1.7–2.4)

## 2019-06-27 LAB — PHOSPHORUS: Phosphorus: 3.3 mg/dL (ref 2.5–4.6)

## 2019-06-29 LAB — CBC
HCT: 38.9 % (ref 36.0–46.0)
Hemoglobin: 12 g/dL (ref 12.0–15.0)
MCH: 24.9 pg — ABNORMAL LOW (ref 26.0–34.0)
MCHC: 30.8 g/dL (ref 30.0–36.0)
MCV: 80.9 fL (ref 80.0–100.0)
Platelets: 307 10*3/uL (ref 150–400)
RBC: 4.81 MIL/uL (ref 3.87–5.11)
RDW: 20 % — ABNORMAL HIGH (ref 11.5–15.5)
WBC: 22.3 10*3/uL — ABNORMAL HIGH (ref 4.0–10.5)
nRBC: 0 % (ref 0.0–0.2)

## 2019-06-29 LAB — BASIC METABOLIC PANEL
Anion gap: 10 (ref 5–15)
BUN: 25 mg/dL — ABNORMAL HIGH (ref 8–23)
CO2: 31 mmol/L (ref 22–32)
Calcium: 8.6 mg/dL — ABNORMAL LOW (ref 8.9–10.3)
Chloride: 100 mmol/L (ref 98–111)
Creatinine, Ser: 0.57 mg/dL (ref 0.44–1.00)
GFR calc Af Amer: >60 mL/min (ref >60)
GFR calc non Af Amer: >60 mL/min (ref >60)
Glucose, Bld: 92 mg/dL (ref 70–99)
Potassium: 3.5 mmol/L (ref 3.5–5.1)
Sodium: 141 mmol/L (ref 135–145)

## 2019-06-29 LAB — MAGNESIUM: Magnesium: 2.1 mg/dL (ref 1.7–2.4)

## 2019-06-29 LAB — PHOSPHORUS: Phosphorus: 3.2 mg/dL (ref 2.5–4.6)

## 2019-06-30 ENCOUNTER — Other Ambulatory Visit (HOSPITAL_COMMUNITY): Payer: Medicare Other

## 2019-06-30 MED ORDER — GENERIC EXTERNAL MEDICATION
Status: DC
Start: ? — End: 2019-06-30

## 2019-07-01 LAB — URINE CULTURE

## 2019-07-01 LAB — CBC
HCT: 36.3 % (ref 36.0–46.0)
Hemoglobin: 11.5 g/dL — ABNORMAL LOW (ref 12.0–15.0)
MCH: 25.2 pg — ABNORMAL LOW (ref 26.0–34.0)
MCHC: 31.7 g/dL (ref 30.0–36.0)
MCV: 79.6 fL — ABNORMAL LOW (ref 80.0–100.0)
Platelets: 257 10*3/uL (ref 150–400)
RBC: 4.56 MIL/uL (ref 3.87–5.11)
RDW: 19.7 % — ABNORMAL HIGH (ref 11.5–15.5)
WBC: 22.7 10*3/uL — ABNORMAL HIGH (ref 4.0–10.5)
nRBC: 0 % (ref 0.0–0.2)

## 2019-07-01 LAB — URINALYSIS, ROUTINE W REFLEX MICROSCOPIC
Bilirubin Urine: NEGATIVE
Glucose, UA: NEGATIVE mg/dL
Hgb urine dipstick: NEGATIVE
Ketones, ur: NEGATIVE mg/dL
Nitrite: NEGATIVE
Protein, ur: NEGATIVE mg/dL
Specific Gravity, Urine: 1.016 (ref 1.005–1.030)
pH: 6 (ref 5.0–8.0)

## 2019-07-01 LAB — RENAL FUNCTION PANEL
Albumin: 2.3 g/dL — ABNORMAL LOW (ref 3.5–5.0)
Anion gap: 9 (ref 5–15)
BUN: 22 mg/dL (ref 8–23)
CO2: 32 mmol/L (ref 22–32)
Calcium: 8.6 mg/dL — ABNORMAL LOW (ref 8.9–10.3)
Chloride: 99 mmol/L (ref 98–111)
Creatinine, Ser: 0.53 mg/dL (ref 0.44–1.00)
GFR calc Af Amer: >60 mL/min (ref >60)
GFR calc non Af Amer: >60 mL/min (ref >60)
Glucose, Bld: 100 mg/dL — ABNORMAL HIGH (ref 70–99)
Phosphorus: 3.4 mg/dL (ref 2.5–4.6)
Potassium: 3.5 mmol/L (ref 3.5–5.1)
Sodium: 140 mmol/L (ref 135–145)

## 2019-07-03 LAB — URINALYSIS, ROUTINE W REFLEX MICROSCOPIC
Bilirubin Urine: NEGATIVE
Glucose, UA: NEGATIVE mg/dL
Hgb urine dipstick: NEGATIVE
Ketones, ur: NEGATIVE mg/dL
Nitrite: NEGATIVE
Protein, ur: NEGATIVE mg/dL
Specific Gravity, Urine: 1.009 (ref 1.005–1.030)
pH: 6 (ref 5.0–8.0)

## 2019-07-03 LAB — BASIC METABOLIC PANEL
Anion gap: 8 (ref 5–15)
BUN: 15 mg/dL (ref 8–23)
CO2: 26 mmol/L (ref 22–32)
Calcium: 8.1 mg/dL — ABNORMAL LOW (ref 8.9–10.3)
Chloride: 104 mmol/L (ref 98–111)
Creatinine, Ser: 0.48 mg/dL (ref 0.44–1.00)
GFR calc Af Amer: >60 mL/min (ref >60)
GFR calc non Af Amer: >60 mL/min (ref >60)
Glucose, Bld: 99 mg/dL (ref 70–99)
Potassium: 3.1 mmol/L — ABNORMAL LOW (ref 3.5–5.1)
Sodium: 138 mmol/L (ref 135–145)

## 2019-07-03 LAB — CBC
HCT: 32.2 % — ABNORMAL LOW (ref 36.0–46.0)
Hemoglobin: 9.9 g/dL — ABNORMAL LOW (ref 12.0–15.0)
MCH: 25.3 pg — ABNORMAL LOW (ref 26.0–34.0)
MCHC: 30.7 g/dL (ref 30.0–36.0)
MCV: 82.1 fL (ref 80.0–100.0)
Platelets: 281 10*3/uL (ref 150–400)
RBC: 3.92 MIL/uL (ref 3.87–5.11)
RDW: 19.5 % — ABNORMAL HIGH (ref 11.5–15.5)
WBC: 17 10*3/uL — ABNORMAL HIGH (ref 4.0–10.5)
nRBC: 0 % (ref 0.0–0.2)

## 2019-07-03 LAB — VANCOMYCIN, TROUGH: Vancomycin Tr: 17 ug/mL (ref 15–20)

## 2019-07-03 LAB — MAGNESIUM: Magnesium: 1.9 mg/dL (ref 1.7–2.4)

## 2019-07-03 NOTE — Progress Notes (Signed)
PROGRESS NOTE    Lenell AntuJudith Mangiapane  WUJ:[id number] DOB: 1941/03/09 DOA: 06/07/2019   Brief Narrative: Kara BardJudith Maclinis an 78 y.o.femalewith history of COPD, emphysema, chronic hypoxemic respiratory failure, aortic aneurysm, atrial regurgitation, breast cancer that was hospitalized 9/19 through 10/1. She returned the following day with worsening dyspnea. At the time of admission she was in significant respiratory distress. She was screened for Covid multiple times without any positive results. At the time of arrival in the hospital she was found to have a PaO2 of 65 on 3 L of oxygen. pH 7.5. WBC count was elevated to 50,000 with 82% neutrophils. Chest x-ray on 05/29/2019 showed emphysema with diffuse interstitial changes and fibrotic appearance with possible superimposed opacities suggestive of superimposed infection, suspected pneumonia. She was started on IV vancomycin, cefepime and was given another course of azithromycin. Blood cultures at the time of admission were negative. MRSA PCR was negative. Legionella antigen was negative. Viral respiratory panel was also negative. Acute on chronic hypoxemic respiratory failure was suspected secondary to COPD exacerbation, superimposed bacterial pneumonia. There was also concern for interstitial lung disease. She had reportedly failed outpatient treatment with Levaquin. Infectious disease was consulted. Patient did require intubation but was ultimately extubated and currently on oxygen by nasal cannula. Antibiotics were later switched to meropenem. BAL cultures did not show anything significant. However, given the severity of her illness infectious disease recommended to continue treatment with meropenem for another 1 to 2 weeks. There was also some concern for CT changes reflecting recurrent malignancy with history of breast cancer and lymphangitic spread.  Patient completed treatment with meropenem.  However, continues to have shortness of  breath with minimal exertion.  Her WBC count was elevated and she started having fevers.  Therefore started again on vancomycin, cefepime.   Assessment & Plan:   Active Problems:   Acute on chronic respiratory failure with hypoxia (HCC)   Interstitial lung disease (HCC)   Lobar pneumonia, unspecified organism (HCC)   COPD, severe (HCC) Leukocytosis Interstitial lung disease History of breast cancer Hypertension Protein calorie malnutrition Debility  Acute on chronic hypoxemic respiratory failure: Multifactorial. Initially thought to be secondary to severe COPD with exacerbation. However, patient also noted to have diffuse interstitial changes could be secondary to pneumonia but could also be interstitial lung disease with pulmonary fibrosis. She had chest CT on 10/17 which showed areas of airspace consolidation in the lung bases bilaterally. High-resolution images demonstrate diffuse bronchial wall thickening with moderate centrilobular and paraseptal emphysema. Received treatment with IV meropenem. On steroids per pulmonary.  -She continues to have shortness of breath even with minimal exertion.  At rest she is on 2 L.  However even when she sits up she becomes very hypoxemic and oxygen requirement going up to 10 L.  Chest x-ray on 06/30/2019 continues to show interstitial opacities. -Unfortunately I think her respiratory failure is likely secondary to pulmonary fibrosis.  She has never been officially diagnosed with pulmonary fibrosis.  She could also have a superimposed pneumonia.  Would recommend to continue treatment with IV vancomycin, cefepime for now as WBC count seems to be responding.  We will plan to treat for duration of 5-7 days pending improvement.  Already on diuretics per the primary team. -However, the pulmonary fibrosis may be irreversible.  She has overall poor prognosis.  Would recommend to initiate discussion about goals of care/palliative.  Bilateral pneumonia: Patient  was previously on azithromycin at the outside hospital. She failed outpatient treatment with Levaquin. Her respiratory status continued  to worsen therefore she was treated with prolonged duration of IV meropenem.   Now having fever, leukocytosis.  Therefore restarted on IV vancomycin, cefepime.  Plan as mentioned above. However, there may be some component of interstitial lung disease/pulmonary fibrosis as well, as mentioned above which could be contributing to the respiratory failure. If she starts having any fevers greater than 101 would recommend to send for pan cultures. She denies cough or sputum production at this time.  Severe COPD with exacerbation: Patient currently on steroids. Pulmonary also following. Continue management per primary team and pulmonary.   Leukocytosis: Likely secondary to the steroids?  Versus pneumonia.   Restarted on antibiotic treatment with IV vancomycin, cefepime.  Continue to monitor counts. Denies cough or sputum. Denies diarrhea.Continue to monitor with steroid taper.  History of breast cancer: There was some concern for progression of her breast cancer and also lymphangitic spread per the discharge notes from the outside facility. Per oncology they advised supportive care. Per outside Oncology,when able, and if she decides to pursue evaluation and treatment she would need CT chest abdomen and pelvis and bone scan with biopsy if any abnormality.  Protein calorie malnutrition: Management per the primary team.  Debility: Continue supportive management and therapy per the primary team.  Due to her complex medical problems she is a high risk for worsening and decompensation.  Plan of care discussed at length with the patient.  She got somewhat emotional.  Answered all of her questions appropriately to the best of my knowledge.   Subjective: She continues to have worsening shortness of breath even with minimal exertion.  At rest she is on 2 L.  However,  even when she tries to sit up she desaturates and is requiring at least 10 L of oxygen by nasal cannula.  WBC count elevated.  Low-grade fevers.  Objective: Vitals: Temperature 99.8, pulse 124, respiratory rate 22, blood pressure 121/61, oxygen saturation 92% Examination:  General exam: Thin appearing female, awake and oriented x3, complaining of shortness of breath with minimal exertion. Respiratory system: Decreased breath sound lower lobes, Velcro crackles heard bilaterally right greater than left.  No rhonchi or wheezing heard at this time. Cardiovascular system: S1 & S2. No murmurs. No pedal edema. Gastrointestinal system: Abdomen is nondistended, soft and nontender. Normal bowel sounds heard. Central nervous system: Alert and oriented. No focal neurological deficits. Extremities: No edema Skin: No rashes Psychiatry: Judgement and insight appear normal. Mood & affect appropriate.     Data Reviewed: I have personally reviewed following labs and imaging studies  CBC: Recent Labs  Lab 06/27/19 0720 06/29/19 0632 07/01/19 0444 07/03/19 0552  WBC 23.8* 22.3* 22.7* 17.0*  HGB 12.7 12.0 11.5* 9.9*  HCT 40.0 38.9 36.3 32.2*  MCV 80.8 80.9 79.6* 82.1  PLT 346 307 257 281   Basic Metabolic Panel: Recent Labs  Lab 06/27/19 0720 06/29/19 0632 07/01/19 0444 07/03/19 0552  NA 140 141 140 138  K 3.8 3.5 3.5 3.1*  CL 100 100 99 104  CO2 28 31 32 26  GLUCOSE 77 92 100* 99  BUN 18 25* 22 15  CREATININE 0.50 0.57 0.53 0.48  CALCIUM 8.6* 8.6* 8.6* 8.1*  MG 2.2 2.1  --  1.9  PHOS 3.3 3.2 3.4  --    GFR: CrCl cannot be calculated (Unknown ideal weight.). Liver Function Tests: Recent Labs  Lab 07/01/19 0444  ALBUMIN 2.3*   No results for input(s): LIPASE, AMYLASE in the last 168 hours. No results for  input(s): AMMONIA in the last 168 hours. Coagulation Profile: No results for input(s): INR, PROTIME in the last 168 hours. Cardiac Enzymes: No results for input(s): CKTOTAL,  CKMB, CKMBINDEX, TROPONINI in the last 168 hours. BNP (last 3 results) No results for input(s): PROBNP in the last 8760 hours. HbA1C: No results for input(s): HGBA1C in the last 72 hours. CBG: No results for input(s): GLUCAP in the last 168 hours. Lipid Profile: No results for input(s): CHOL, HDL, LDLCALC, TRIG, CHOLHDL, LDLDIRECT in the last 72 hours. Thyroid Function Tests: No results for input(s): TSH, T4TOTAL, FREET4, T3FREE, THYROIDAB in the last 72 hours. Anemia Panel: No results for input(s): VITAMINB12, FOLATE, FERRITIN, TIBC, IRON, RETICCTPCT in the last 72 hours. Sepsis Labs: No results for input(s): PROCALCITON, LATICACIDVEN in the last 168 hours.  Recent Results (from the past 240 hour(s))  Culture, respiratory (non-expectorated)     Status: None (Preliminary result)   Collection Time: 07/01/19  4:58 AM   Specimen: Tracheal Aspirate; Respiratory  Result Value Ref Range Status   Specimen Description TRACHEAL ASPIRATE  Final   Special Requests NONE  Final   Gram Stain   Final    FEW WBC PRESENT, PREDOMINANTLY PMN ABUNDANT GRAM POSITIVE COCCI IN CLUSTERS FEW GRAM POSITIVE RODS FEW YEAST Performed at [facility], [place] [address], [place], [place] [address]    Culture ABUNDANT YEAST  Final   Report Status PENDING  Incomplete  Culture, Urine     Status: Abnormal   Collection Time: 07/01/19  5:40 AM   Specimen: Urine, Random  Result Value Ref Range Status   Specimen Description URINE, RANDOM  Final   Special Requests   Final    NONE Performed at [facility], [place] [address] [place], [place] [address]    Culture MULTIPLE SPECIES PRESENT, SUGGEST RECOLLECTION (A)  Final   Report Status 07/01/2019 FINAL  Final  Culture, blood (routine x 2)     Status: None (Preliminary result)   Collection Time: 07/01/19  2:36 PM   Specimen: BLOOD LEFT HAND  Result Value Ref Range Status   Specimen Description BLOOD LEFT HAND  Final   Special Requests   Final    BOTTLES  DRAWN AEROBIC ONLY Blood Culture adequate volume   Culture   Final    NO GROWTH < 24 HOURS Performed at [facility], [place] [address] [place], [place] [address]    Report Status PENDING  Incomplete  Culture, blood (routine x 2)     Status: None (Preliminary result)   Collection Time: 07/01/19  2:36 PM   Specimen: BLOOD LEFT HAND  Result Value Ref Range Status   Specimen Description BLOOD LEFT HAND  Final   Special Requests   Final    BOTTLES DRAWN AEROBIC ONLY Blood Culture adequate volume   Culture   Final    NO GROWTH < 24 HOURS Performed at [facility], [place] [address] [place], [place] [address]    Report Status PENDING  Incomplete         Radiology Studies: No results found.      Scheduled Meds: Please see MAR   LOS: 0 days        Yaakov Guthrie, MD 07/03/2019, 10:47 AM

## 2019-07-04 LAB — MAGNESIUM: Magnesium: 2 mg/dL (ref 1.7–2.4)

## 2019-07-04 LAB — URINE CULTURE: Culture: NO GROWTH

## 2019-07-04 LAB — POTASSIUM: Potassium: 3.5 mmol/L (ref 3.5–5.1)

## 2019-07-05 LAB — CULTURE, RESPIRATORY W GRAM STAIN

## 2019-07-06 LAB — BASIC METABOLIC PANEL
Anion gap: 8 (ref 5–15)
BUN: 14 mg/dL (ref 8–23)
CO2: 27 mmol/L (ref 22–32)
Calcium: 8.5 mg/dL — ABNORMAL LOW (ref 8.9–10.3)
Chloride: 105 mmol/L (ref 98–111)
Creatinine, Ser: 0.53 mg/dL (ref 0.44–1.00)
GFR calc Af Amer: >60 mL/min (ref >60)
GFR calc non Af Amer: >60 mL/min (ref >60)
Glucose, Bld: 86 mg/dL (ref 70–99)
Potassium: 3.3 mmol/L — ABNORMAL LOW (ref 3.5–5.1)
Sodium: 140 mmol/L (ref 135–145)

## 2019-07-06 LAB — CULTURE, BLOOD (ROUTINE X 2)
Culture: NO GROWTH
Culture: NO GROWTH
Special Requests: ADEQUATE
Special Requests: ADEQUATE

## 2019-07-06 LAB — CBC
HCT: 32.3 % — ABNORMAL LOW (ref 36.0–46.0)
Hemoglobin: 10 g/dL — ABNORMAL LOW (ref 12.0–15.0)
MCH: 25 pg — ABNORMAL LOW (ref 26.0–34.0)
MCHC: 31 g/dL (ref 30.0–36.0)
MCV: 80.8 fL (ref 80.0–100.0)
Platelets: 396 10*3/uL (ref 150–400)
RBC: 4 MIL/uL (ref 3.87–5.11)
RDW: 19.2 % — ABNORMAL HIGH (ref 11.5–15.5)
WBC: 14.1 10*3/uL — ABNORMAL HIGH (ref 4.0–10.5)
nRBC: 0 % (ref 0.0–0.2)

## 2019-07-06 LAB — MAGNESIUM: Magnesium: 2.2 mg/dL (ref 1.7–2.4)

## 2019-07-07 LAB — NOVEL CORONAVIRUS, NAA (HOSP ORDER, SEND-OUT TO REF LAB; TAT 18-24 HRS): SARS-CoV-2, NAA: NOT DETECTED

## 2019-07-07 LAB — POTASSIUM: Potassium: 3.4 mmol/L — ABNORMAL LOW (ref 3.5–5.1)

## 2020-11-24 IMAGING — CT CT CHEST HIGH RESOLUTION W/O CM
2 of 7 series · 14 of 36 positions shown, 17 images · non-contrast
Comparison: No priors.

CLINICAL DATA: 78-year-old female with clinical concern for
interstitial lung disease.

EXAM:
CT CHEST WITHOUT CONTRAST
TECHNIQUE: Multidetector CT imaging of the chest was performed following the
standard protocol without intravenous contrast. High resolution
imaging of the lungs, as well as inspiratory and expiratory imaging,
was performed.

[Series 6: coronals · coronal · 0.60mm/px · 3 of 104 slices shown]
[im 21/104  lung]
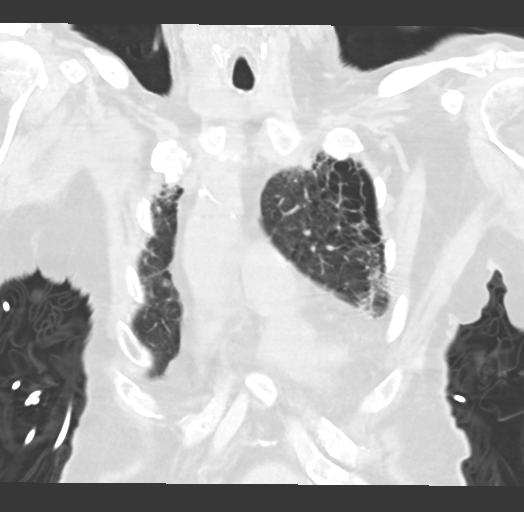
[im 42/104  lung]
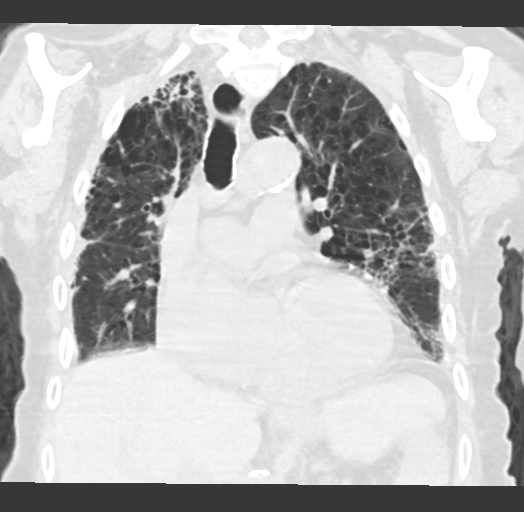
[im 62/104  lung]
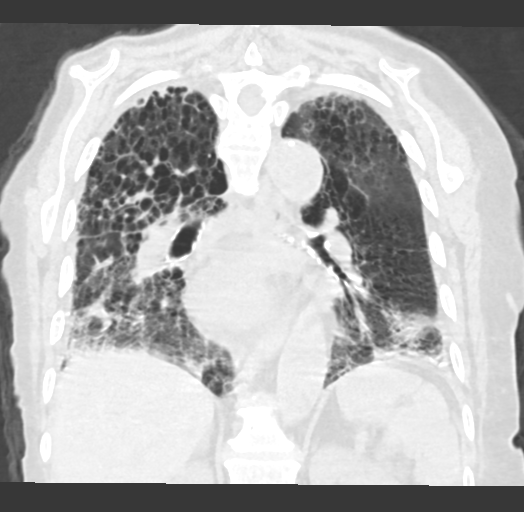

[Series 9: hi res thins · axial · 0.55mm/px · z∈[-995,-765]mm · 11 of 276 slices shown, 14 images]
[im 23/276  mediastinal]
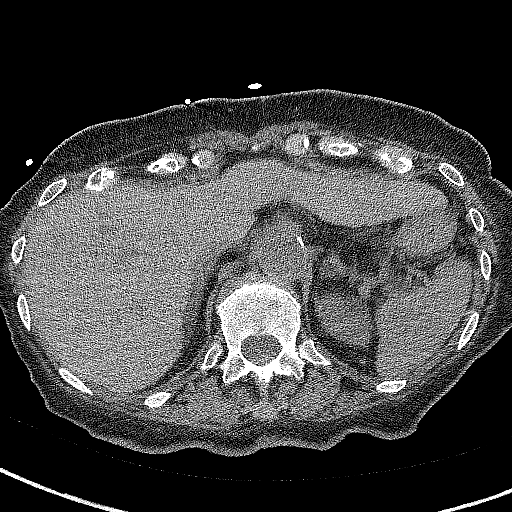
[im 23/276  lung]
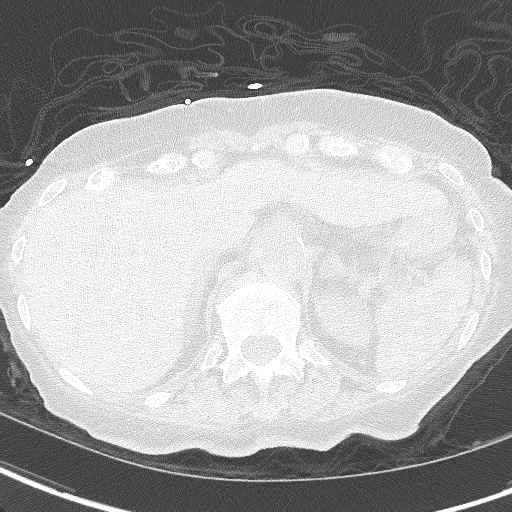
[im 46/276  lung]
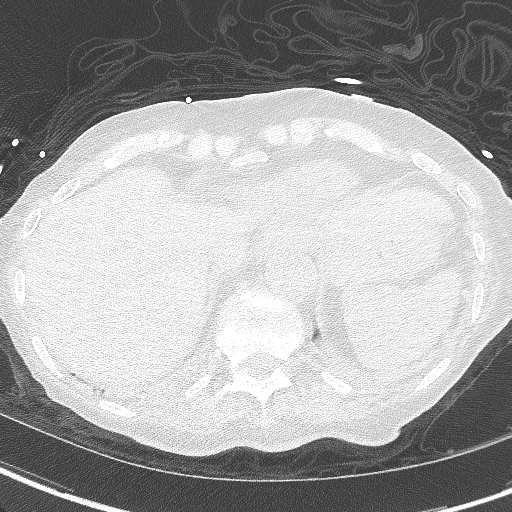
[im 69/276  lung]
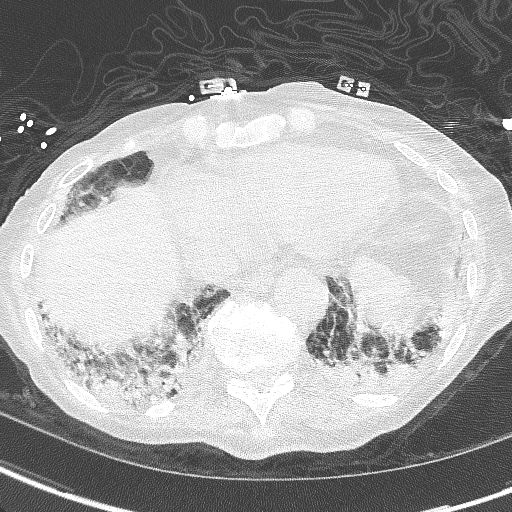
[im 92/276  lung]
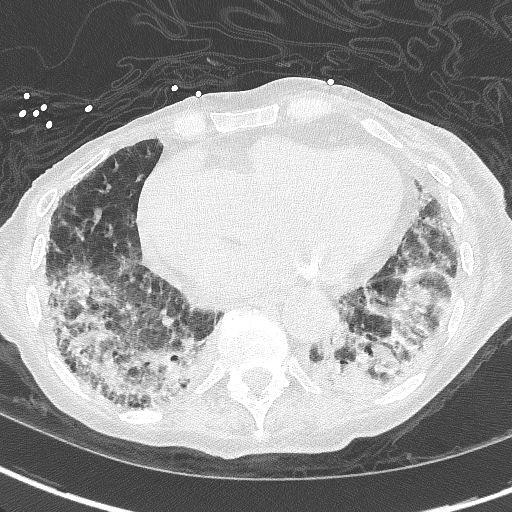
[im 115/276  mediastinal]
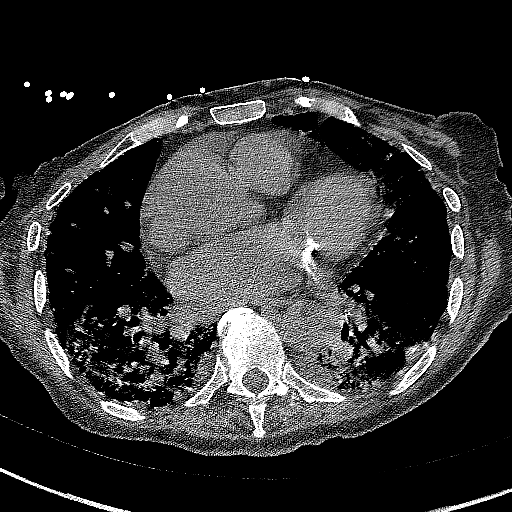
[im 115/276  lung]
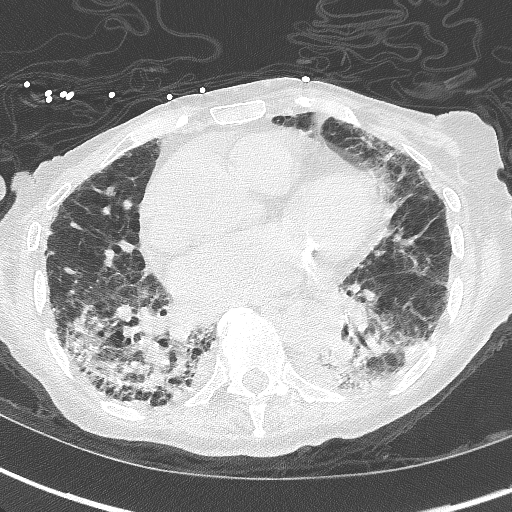
[im 138/276  lung]
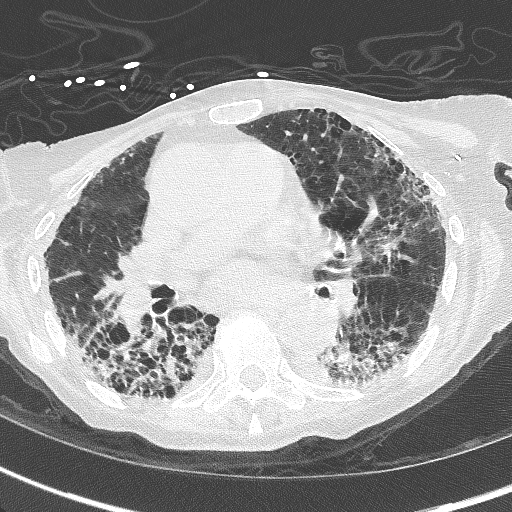
[im 161/276  lung]
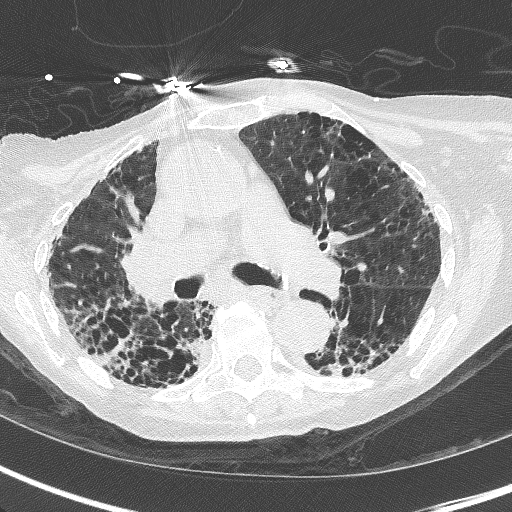
[im 184/276  lung]
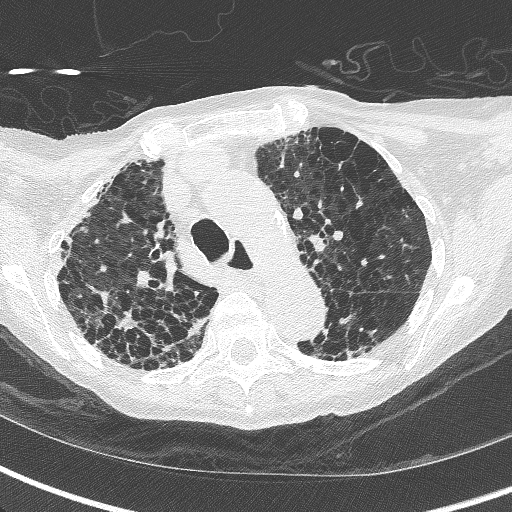
[im 207/276  mediastinal]
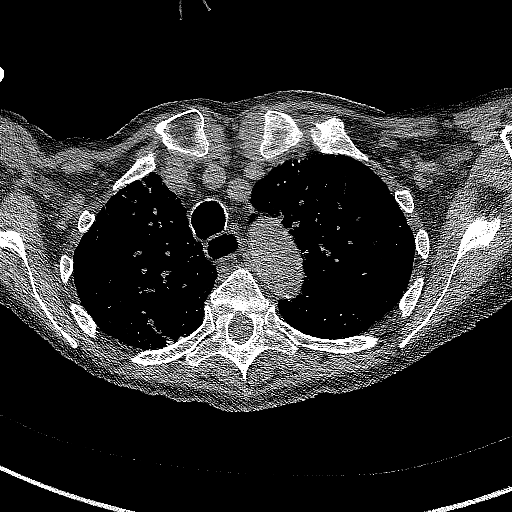
[im 207/276  lung]
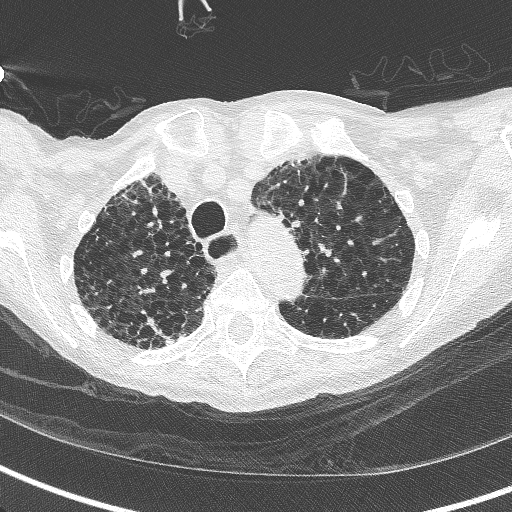
[im 230/276  lung]
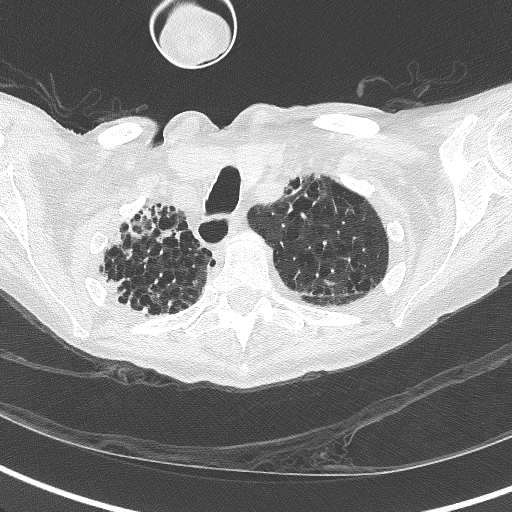
[im 253/276  lung]
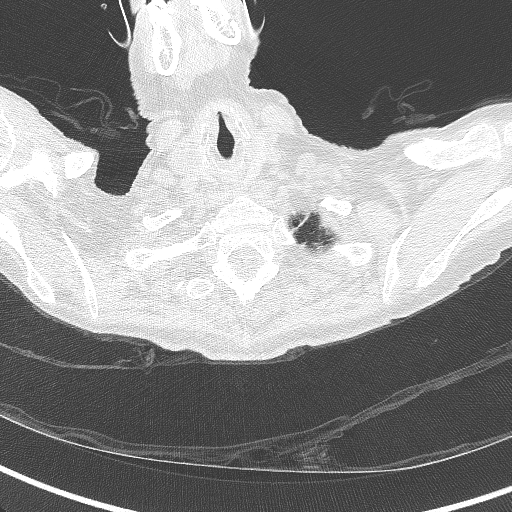

[14 of 36 positions shown; findings below may reference images not displayed]

FINDINGS: Cardiovascular: Heart size is normal. There is no significant
pericardial fluid, thickening or pericardial calcification. There is
aortic atherosclerosis, as well as atherosclerosis of the great
vessels of the mediastinum and the coronary arteries, including
calcified atherosclerotic plaque in the left circumflex and right
coronary arteries. Severe calcifications of the mitral annulus.
Aneurysmal dilatation of the ascending thoracic aorta (4.5 cm in
diameter).

Mediastinum/Nodes: No pathologically enlarged mediastinal or hilar
lymph nodes. Please note that accurate exclusion of hilar adenopathy
is limited on noncontrast CT scans. Esophagus is unremarkable in
appearance. No axillary lymphadenopathy.

Lungs/Pleura: Study is limited by extensive patient respiratory
motion and areas of airspace consolidation in the lung bases
bilaterally. With these limitations in mind, high-resolution images
demonstrate diffuse bronchial wall thickening with moderate
centrilobular and paraseptal emphysema. There are areas of septal
thickening most evident throughout the mid to lower lungs
bilaterally, however, this is confounded by apparent acute airspace
consolidation. Some areas appear to reflect honeycombing, however,
this may simply represent emphysema with superimposed septal
thickening. Inspiratory and expiratory imaging is unremarkable.
Airspace consolidation is most evident in the lower lobes of the
lungs bilaterally. No pleural effusions. No definite suspicious
appearing pulmonary nodules or masses are noted.

Upper Abdomen: Aortic atherosclerosis.

Musculoskeletal: There are no aggressive appearing lytic or blastic
lesions noted in the visualized portions of the skeleton.
IMPRESSION: 1. Study is essentially nondiagnostic for accurate assessment of
interstitial lung disease in the setting of acute infection
(bilateral lower lobe pneumonia). If there is persistent clinical
concern for interstitial lung disease, the patient should be
reimaged with high-resolution chest CT in 3-6 months after complete
resolution of the pneumonia to better evaluate potential
interstitial lung disease.
2. Aortic atherosclerosis, in addition to 2 vessel coronary artery
disease. In addition, there is mild aneurysmal dilatation of the
ascending thoracic aorta (4.5 cm in diameter). Ascending thoracic
aortic aneurysm. Recommend semi-annual imaging followup by CTA or
MRA and referral to cardiothoracic surgery if not already obtained.
This recommendation follows 5616
ACCF/AHA/AATS/ACR/ASA/SCA/EBADAT/KLEBER/VIVKE/LEDYS Guidelines for the
Diagnosis and Management of Patients With Thoracic Aortic Disease.
Circulation. 5616; 121: [id number]. Aortic aneurysm NOS
(FLE68-G22.F).
3. There are calcifications of the aortic valve. Echocardiographic
correlation for evaluation of potential valvular dysfunction may be
warranted if clinically indicated.
4. Diffuse bronchial wall thickening with moderate centrilobular and
paraseptal emphysema; imaging findings suggestive of underlying
COPD.

Aortic Atherosclerosis (FLE68-51C.C), Aortic aneurysm NOS
(FLE68-G22.F) and Emphysema (FLE68-40N.A).
